# Patient Record
Sex: Female | Born: 1955 | Race: Black or African American | Hispanic: No | Marital: Single | Smoking: Never smoker
Health system: Southern US, Community
[De-identification: ages and names within clinical notes are randomized; demographics above are authoritative.]

---

## 2008-07-18 ENCOUNTER — Emergency Department (HOSPITAL_BASED_OUTPATIENT_CLINIC_OR_DEPARTMENT_OTHER): Admission: EM | Admit: 2008-07-18 | Discharge: 2008-07-18 | Payer: Self-pay | Admitting: Emergency Medicine

## 2008-07-18 ENCOUNTER — Ambulatory Visit: Payer: Self-pay | Admitting: Diagnostic Radiology

## 2009-12-22 ENCOUNTER — Ambulatory Visit (HOSPITAL_BASED_OUTPATIENT_CLINIC_OR_DEPARTMENT_OTHER): Admission: RE | Admit: 2009-12-22 | Discharge: 2009-12-22 | Payer: Self-pay | Admitting: Unknown Physician Specialty

## 2009-12-22 ENCOUNTER — Ambulatory Visit: Payer: Self-pay | Admitting: Diagnostic Radiology

## 2010-07-08 LAB — URINALYSIS, ROUTINE W REFLEX MICROSCOPIC
Bilirubin Urine: NEGATIVE
Protein, ur: NEGATIVE mg/dL
Urobilinogen, UA: 2 mg/dL — ABNORMAL HIGH (ref 0.0–1.0)

## 2010-07-08 LAB — BASIC METABOLIC PANEL
CO2: 26 mEq/L (ref 19–32)
Calcium: 9.4 mg/dL (ref 8.4–10.5)
Chloride: 107 mEq/L (ref 96–112)
GFR calc Af Amer: >60 mL/min (ref >60)
Sodium: 140 mEq/L (ref 135–145)

## 2010-07-08 LAB — CBC
Hemoglobin: 10.4 g/dL — ABNORMAL LOW (ref 12.0–15.0)
MCHC: 32.3 g/dL (ref 30.0–36.0)
MCV: 76.9 fL — ABNORMAL LOW (ref 78.0–100.0)
RBC: 4.2 MIL/uL (ref 3.87–5.11)
WBC: 5 10*3/uL (ref 4.0–10.5)

## 2010-07-08 LAB — DIFFERENTIAL
Basophils Relative: 3 % — ABNORMAL HIGH (ref 0–1)
Lymphs Abs: 1.2 10*3/uL (ref 0.7–4.0)
Monocytes Absolute: 0.9 10*3/uL (ref 0.1–1.0)
Monocytes Relative: 18 % — ABNORMAL HIGH (ref 3–12)
Neutro Abs: 2.6 10*3/uL (ref 1.7–7.7)

## 2010-12-07 ENCOUNTER — Other Ambulatory Visit (HOSPITAL_BASED_OUTPATIENT_CLINIC_OR_DEPARTMENT_OTHER): Payer: Self-pay | Admitting: Unknown Physician Specialty

## 2010-12-07 DIAGNOSIS — Z1231 Encounter for screening mammogram for malignant neoplasm of breast: Secondary | ICD-10-CM

## 2010-12-24 ENCOUNTER — Ambulatory Visit (HOSPITAL_BASED_OUTPATIENT_CLINIC_OR_DEPARTMENT_OTHER): Payer: Self-pay

## 2010-12-29 ENCOUNTER — Ambulatory Visit (HOSPITAL_BASED_OUTPATIENT_CLINIC_OR_DEPARTMENT_OTHER)
Admission: RE | Admit: 2010-12-29 | Discharge: 2010-12-29 | Disposition: A | Discharge status: Home / Self Care | Payer: Self-pay | Source: Ambulatory Visit | Attending: Unknown Physician Specialty | Admitting: Unknown Physician Specialty

## 2010-12-29 DIAGNOSIS — Z1231 Encounter for screening mammogram for malignant neoplasm of breast: Secondary | ICD-10-CM | POA: Insufficient documentation

## 2017-05-31 ENCOUNTER — Encounter: Payer: Self-pay | Admitting: Family Medicine

## 2017-05-31 ENCOUNTER — Ambulatory Visit: Payer: BC Managed Care – PPO | Admitting: Family Medicine

## 2017-05-31 VITALS — BP 136/90 | HR 79 | Ht 71.0 in | Wt 225.0 lb

## 2017-05-31 DIAGNOSIS — E0590 Thyrotoxicosis, unspecified without thyrotoxic crisis or storm: Secondary | ICD-10-CM | POA: Insufficient documentation

## 2017-05-31 DIAGNOSIS — Z0000 Encounter for general adult medical examination without abnormal findings: Secondary | ICD-10-CM | POA: Diagnosis not present

## 2017-05-31 DIAGNOSIS — I10 Essential (primary) hypertension: Secondary | ICD-10-CM | POA: Diagnosis not present

## 2017-05-31 DIAGNOSIS — E0500 Thyrotoxicosis with diffuse goiter without thyrotoxic crisis or storm: Secondary | ICD-10-CM | POA: Diagnosis not present

## 2017-05-31 LAB — COMPREHENSIVE METABOLIC PANEL
ALT: 10 U/L (ref 0–35)
AST: 13 U/L (ref 0–37)
Albumin: 4.3 g/dL (ref 3.5–5.2)
Alkaline Phosphatase: 83 U/L (ref 39–117)
BILIRUBIN TOTAL: 0.5 mg/dL (ref 0.2–1.2)
BUN: 17 mg/dL (ref 6–23)
CO2: 32 meq/L (ref 19–32)
CREATININE: 0.8 mg/dL (ref 0.40–1.20)
Calcium: 10.2 mg/dL (ref 8.4–10.5)
Chloride: 99 mEq/L (ref 96–112)
GFR: 94.27 mL/min (ref >60.00)
GLUCOSE: 102 mg/dL — AB (ref 70–99)
Potassium: 4 mEq/L (ref 3.5–5.1)
SODIUM: 137 meq/L (ref 135–145)
Total Protein: 7.8 g/dL (ref 6.0–8.3)

## 2017-05-31 LAB — CBC
HCT: 43.2 % (ref 36.0–46.0)
Hemoglobin: 14.7 g/dL (ref 12.0–15.0)
MCHC: 34 g/dL (ref 30.0–36.0)
MCV: 95.4 fl (ref 78.0–100.0)
Platelets: 211 10*3/uL (ref 150.0–400.0)
RBC: 4.53 Mil/uL (ref 3.87–5.11)
RDW: 15.7 % — AB (ref 11.5–15.5)
WBC: 4.5 10*3/uL (ref 4.0–10.5)

## 2017-05-31 LAB — URINALYSIS, ROUTINE W REFLEX MICROSCOPIC
BILIRUBIN URINE: NEGATIVE
Ketones, ur: NEGATIVE
LEUKOCYTES UA: NEGATIVE
Nitrite: NEGATIVE
PH: 6 (ref 5.0–8.0)
SPECIFIC GRAVITY, URINE: 1.015 (ref 1.000–1.030)
TOTAL PROTEIN, URINE-UPE24: NEGATIVE
UROBILINOGEN UA: 0.2 (ref 0.0–1.0)
Urine Glucose: NEGATIVE

## 2017-05-31 LAB — LIPID PANEL
CHOL/HDL RATIO: 3
Cholesterol: 217 mg/dL — ABNORMAL HIGH (ref 0–200)
HDL: 85.3 mg/dL (ref >39.00)
LDL CALC: 114 mg/dL — AB (ref 0–99)
NONHDL: 132.1
Triglycerides: 91 mg/dL (ref 0.0–149.0)
VLDL: 18.2 mg/dL (ref 0.0–40.0)

## 2017-05-31 MED ORDER — POTASSIUM CHLORIDE CRYS ER 20 MEQ PO TBCR: 20.0000 meq | EXTENDED_RELEASE_TABLET | Freq: Every day | ORAL | 0 refills | Status: DC

## 2017-05-31 MED ORDER — HYDROCHLOROTHIAZIDE 25 MG PO TABS: 25.0000 mg | ORAL_TABLET | Freq: Every day | ORAL | 0 refills | Status: DC

## 2017-05-31 NOTE — Progress Notes (Addendum)
Subjective:  Patient ID: Tammy Booker, female    DOB: [birth year]  Age: 62 y.o. MRN: [id number]  CC: Establish Care   HPI Tammy Booker presents for this is establishment of care and refill her medicines she has long-standing hypertension controlled with 25 mg of HCTZ.  She comes in on 20 mEq of potassium chloride twice daily.  She has also been taking Tapazole 15 mg daily for the last 9-10 years.  She had taken it for a year and then it was discontinued but she became hyperthyroid and it was restarted.  She has been taking his medicine ever since.  He has been seen [facility] the [facility] and tells me that blood work is regularly been drawn.  She lives alone.  She has 3 grown children.  2 sons and a daughter.  One her daughter is in the [employer].  Her father is 60 and has heart issues and alcoholism.  Some years ago he shot and killed her mother.  Pap and mammogram were performed late last year.  History Tammy Booker has no past medical history on file.   She has no past surgical history on file.   Her family history is not on file.She reports that  has never smoked. she has never used smokeless tobacco. Her alcohol and drug histories are not on file.  Outpatient Medications Prior to Visit  Medication Sig Dispense Refill  . hydrochlorothiazide (HYDRODIURIL) 25 MG tablet Take 25 mg by mouth daily.    . methimazole (TAPAZOLE) 10 MG tablet Take 1 and 1/2 tablets by mouth daily.    . potassium chloride SA (K-DUR,KLOR-CON) 20 MEQ tablet Take 20 mEq by mouth 2 (two) times daily.     No facility-administered medications prior to visit.     ROS Review of Systems  Constitutional: Negative.   HENT: Negative.   Eyes: Negative for photophobia and visual disturbance.  Respiratory: Negative.   Cardiovascular: Negative.  Negative for palpitations.  Gastrointestinal: Negative.   Endocrine: Negative for cold intolerance and heat intolerance.  Genitourinary: Negative.   Musculoskeletal: Negative for  arthralgias and myalgias.  Skin: Negative.   Allergic/Immunologic: Negative for immunocompromised state.  Neurological: Negative.   Hematological: Negative.   Psychiatric/Behavioral: Negative.     Objective:  BP 136/90 (BP Location: Right Arm, Patient Position: Sitting, Cuff Size: Normal)   Pulse 79   Ht 5\' 11"  (1.803 m)   Wt 225 lb (102.1 kg)   SpO2 98%   BMI 31.38 kg/m   Physical Exam  Constitutional: She is oriented to person, place, and time. She appears well-developed and well-nourished. No distress.  HENT:  Head: Normocephalic and atraumatic.  Right Ear: External ear normal.  Left Ear: External ear normal.  Mouth/Throat: Oropharynx is clear and moist. No oropharyngeal exudate.  Eyes: Conjunctivae are normal. Pupils are equal, round, and reactive to light. Right eye exhibits no discharge. Left eye exhibits no discharge. No scleral icterus.  Neck: Neck supple. No JVD present. No tracheal deviation present. No thyromegaly (normal size without masses. no temdermess to palpation) present.  Cardiovascular: Normal rate, regular rhythm and normal heart sounds.  Pulmonary/Chest: Effort normal and breath sounds normal. No stridor.  Abdominal: Bowel sounds are normal.  Lymphadenopathy:    She has no cervical adenopathy.  Neurological: She is alert and oriented to person, place, and time.  Skin: Skin is warm and dry. She is not diaphoretic.  Psychiatric: She has a normal mood and affect.      Assessment & Plan:  Tammy Booker was seen today for establish care.  Diagnoses and all orders for this visit:  Essential hypertension -     CBC -     Comprehensive metabolic panel -     hydrochlorothiazide (HYDRODIURIL) 25 MG tablet; Take 1 tablet (25 mg total) by mouth daily. -     potassium chloride SA (K-DUR,KLOR-CON) 20 MEQ tablet; Take 1 tablet (20 mEq total) by mouth daily.  Hyperthyroidism -     CBC -     Cancel: Thyroid Profile -     Thyroid stimulating immunoglobulin -      Thyroid Profile; Future  Health care maintenance -     CBC -     Comprehensive metabolic panel -     HIV antibody -     Lipid panel -     Urinalysis, Routine w reflex microscopic  Graves disease -     Thyroid stimulating immunoglobulin -     methimazole (TAPAZOLE) 10 MG tablet; Take 1.5 tablets (15 mg total) by mouth daily. Take 1 and 1/2 tablets by mouth daily. -     Ambulatory referral to Endocrinology  Is I have changed Tammy Booker's hydrochlorothiazide, potassium chloride SA, and methimazole.  Meds ordered this encounter  Medications  . hydrochlorothiazide (HYDRODIURIL) 25 MG tablet    Sig: Take 1 tablet (25 mg total) by mouth daily.    Dispense:  100 tablet    Refill:  0  . potassium chloride SA (K-DUR,KLOR-CON) 20 MEQ tablet    Sig: Take 1 tablet (20 mEq total) by mouth daily.    Dispense:  100 tablet    Refill:  0  . methimazole (TAPAZOLE) 10 MG tablet    Sig: Take 1.5 tablets (15 mg total) by mouth daily. Take 1 and 1/2 tablets by mouth daily.    Dispense:  135 tablet    Refill:  1   ON [employer] and then sign and this is for the annual on his visit reference manual spine and anxiety as patient appears to her that she also noticed every  We will check thyroid functions and thyroid antibodies today.  And hopefully will proceed with discontinuation of the Tapazole.  We will monitor her thyroid functions every 3 months for recurrence of her Graves' disease his long-standing bodies have returned to normal.  Went ahead and checked the rest of her blood work today however she is not fasting today.  She will need a colonoscopy at some point.  Follow-up: Return in about 3 months (around 08/31/2017).  Mliss SaxWilliam Alfred Ashonte Angelucci, MD

## 2017-05-31 NOTE — Patient Instructions (Addendum)
DASH Eating Plan DASH stands for "Dietary Approaches to Stop Hypertension." The DASH eating plan is a healthy eating plan that has been shown to reduce high blood pressure (hypertension). It may also reduce your risk for type 2 diabetes, heart disease, and stroke. The DASH eating plan may also help with weight loss. What are tips for following this plan? General guidelines  Avoid eating more than 2,300 mg (milligrams) of salt (sodium) a day. If you have hypertension, you may need to reduce your sodium intake to 1,500 mg a day.  Limit alcohol intake to no more than 1 drink a day for nonpregnant women and 2 drinks a day for men. One drink equals 12 oz of beer, 5 oz of wine, or 1 oz of hard liquor.  Work with your health care provider to maintain a healthy body weight or to lose weight. Ask what an ideal weight is for you.  Get at least 30 minutes of exercise that causes your heart to beat faster (aerobic exercise) most days of the week. Activities may include walking, swimming, or biking.  Work with your health care provider or diet and nutrition specialist (dietitian) to adjust your eating plan to your individual calorie needs. Reading food labels  Check food labels for the amount of sodium per serving. Choose foods with less than 5 percent of the Daily Value of sodium. Generally, foods with less than 300 mg of sodium per serving fit into this eating plan.  To find whole grains, look for the word "whole" as the first word in the ingredient list. Shopping  Buy products labeled as "low-sodium" or "no salt added."  Buy fresh foods. Avoid canned foods and premade or frozen meals. Cooking  Avoid adding salt when cooking. Use salt-free seasonings or herbs instead of table salt or sea salt. Check with your health care provider or pharmacist before using salt substitutes.  Do not fry foods. Cook foods using healthy methods such as baking, boiling, grilling, and broiling instead.  Cook with  heart-healthy oils, such as olive, canola, soybean, or sunflower oil. Meal planning   Eat a balanced diet that includes: ? 5 or more servings of fruits and vegetables each day. At each meal, try to fill half of your plate with fruits and vegetables. ? Up to 6-8 servings of whole grains each day. ? Less than 6 oz of lean meat, poultry, or fish each day. A 3-oz serving of meat is about the same size as a deck of cards. One egg equals 1 oz. ? 2 servings of low-fat dairy each day. ? A serving of nuts, seeds, or beans 5 times each week. ? Heart-healthy fats. Healthy fats called Omega-3 fatty acids are found in foods such as flaxseeds and coldwater fish, like sardines, salmon, and mackerel.  Limit how much you eat of the following: ? Canned or prepackaged foods. ? Food that is high in trans fat, such as fried foods. ? Food that is high in saturated fat, such as fatty meat. ? Sweets, desserts, sugary drinks, and other foods with added sugar. ? Full-fat dairy products.  Do not salt foods before eating.  Try to eat at least 2 vegetarian meals each week.  Eat more home-cooked food and less restaurant, buffet, and fast food.  When eating at a restaurant, ask that your food be prepared with less salt or no salt, if possible. What foods are recommended? The items listed may not be a complete list. Talk with your dietitian about what   dietary choices are best for you. Grains Whole-grain or whole-wheat bread. Whole-grain or whole-wheat pasta. Brown rice. Oatmeal. Quinoa. Bulgur. Whole-grain and low-sodium cereals. Pita bread. Low-fat, low-sodium crackers. Whole-wheat flour tortillas. Vegetables Fresh or frozen vegetables (raw, steamed, roasted, or grilled). Low-sodium or reduced-sodium tomato and vegetable juice. Low-sodium or reduced-sodium tomato sauce and tomato paste. Low-sodium or reduced-sodium canned vegetables. Fruits All fresh, dried, or frozen fruit. Canned fruit in natural juice (without  added sugar). Meat and other protein foods Skinless chicken or turkey. Ground chicken or turkey. Pork with fat trimmed off. Fish and seafood. Egg whites. Dried beans, peas, or lentils. Unsalted nuts, nut butters, and seeds. Unsalted canned beans. Lean cuts of beef with fat trimmed off. Low-sodium, lean deli meat. Dairy Low-fat (1%) or fat-free (skim) milk. Fat-free, low-fat, or reduced-fat cheeses. Nonfat, low-sodium ricotta or cottage cheese. Low-fat or nonfat yogurt. Low-fat, low-sodium cheese. Fats and oils Soft margarine without trans fats. Vegetable oil. Low-fat, reduced-fat, or light mayonnaise and salad dressings (reduced-sodium). Canola, safflower, olive, soybean, and sunflower oils. Avocado. Seasoning and other foods Herbs. Spices. Seasoning mixes without salt. Unsalted popcorn and pretzels. Fat-free sweets. What foods are not recommended? The items listed may not be a complete list. Talk with your dietitian about what dietary choices are best for you. Grains Baked goods made with fat, such as croissants, muffins, or some breads. Dry pasta or rice meal packs. Vegetables Creamed or fried vegetables. Vegetables in a cheese sauce. Regular canned vegetables (not low-sodium or reduced-sodium). Regular canned tomato sauce and paste (not low-sodium or reduced-sodium). Regular tomato and vegetable juice (not low-sodium or reduced-sodium). Pickles. Olives. Fruits Canned fruit in a light or heavy syrup. Fried fruit. Fruit in cream or butter sauce. Meat and other protein foods Fatty cuts of meat. Ribs. Fried meat. Bacon. Sausage. Bologna and other processed lunch meats. Salami. Fatback. Hotdogs. Bratwurst. Salted nuts and seeds. Canned beans with added salt. Canned or smoked fish. Whole eggs or egg yolks. Chicken or turkey with skin. Dairy Whole or 2% milk, cream, and half-and-half. Whole or full-fat cream cheese. Whole-fat or sweetened yogurt. Full-fat cheese. Nondairy creamers. Whipped toppings.  Processed cheese and cheese spreads. Fats and oils Butter. Stick margarine. Lard. Shortening. Ghee. Bacon fat. Tropical oils, such as coconut, palm kernel, or palm oil. Seasoning and other foods Salted popcorn and pretzels. Onion salt, garlic salt, seasoned salt, table salt, and sea salt. Worcestershire sauce. Tartar sauce. Barbecue sauce. Teriyaki sauce. Soy sauce, including reduced-sodium. Steak sauce. Canned and packaged gravies. Fish sauce. Oyster sauce. Cocktail sauce. Horseradish that you find on the shelf. Ketchup. Mustard. Meat flavorings and tenderizers. Bouillon cubes. Hot sauce and Tabasco sauce. Premade or packaged marinades. Premade or packaged taco seasonings. Relishes. Regular salad dressings. Where to find more information:  National Heart, Lung, and Blood Institute: www.nhlbi.nih.gov  American Heart Association: www.heart.org Summary  The DASH eating plan is a healthy eating plan that has been shown to reduce high blood pressure (hypertension). It may also reduce your risk for type 2 diabetes, heart disease, and stroke.  With the DASH eating plan, you should limit salt (sodium) intake to 2,300 mg a day. If you have hypertension, you may need to reduce your sodium intake to 1,500 mg a day.  When on the DASH eating plan, aim to eat more fresh fruits and vegetables, whole grains, lean proteins, low-fat dairy, and heart-healthy fats.  Work with your health care provider or diet and nutrition specialist (dietitian) to adjust your eating plan to your individual   calorie needs. This information is not intended to replace advice given to you by your health care provider. Make sure you discuss any questions you have with your health care provider. Document Released: 03/04/2011 Document Revised: 03/08/2016 Document Reviewed: 03/08/2016 Elsevier Interactive Patient Education  2018 Elsevier Inc.  

## 2017-06-02 LAB — THYROID STIMULATING IMMUNOGLOBULIN: TSI: 219 % baseline — ABNORMAL HIGH (ref <140)

## 2017-06-02 LAB — HIV ANTIBODY (ROUTINE TESTING W REFLEX): HIV 1&2 Ab, 4th Generation: NONREACTIVE

## 2017-06-03 NOTE — Addendum Note (Signed)
Addended by: Marcell AngerSELF, SARAH E on: 06/03/2017 11:08 AM   Modules accepted: Orders

## 2017-06-09 ENCOUNTER — Other Ambulatory Visit (INDEPENDENT_AMBULATORY_CARE_PROVIDER_SITE_OTHER): Payer: BC Managed Care – PPO

## 2017-06-09 DIAGNOSIS — E0590 Thyrotoxicosis, unspecified without thyrotoxic crisis or storm: Secondary | ICD-10-CM

## 2017-06-10 LAB — THYROID PANEL
Free Thyroxine Index: 1.8 (ref 1.2–4.9)
T3 Uptake Ratio: 27 % (ref 24–39)
T4 TOTAL: 6.6 ug/dL (ref 4.5–12.0)

## 2017-06-10 MED ORDER — METHIMAZOLE 10 MG PO TABS: 15.0000 mg | ORAL_TABLET | Freq: Every day | ORAL | 1 refills | Status: DC

## 2017-06-10 NOTE — Addendum Note (Signed)
Addended by: Andrez GrimeKREMER, WILLIAM A on: 06/10/2017 08:22 AM   Modules accepted: Orders

## 2017-06-17 ENCOUNTER — Telehealth: Payer: Self-pay

## 2017-06-17 NOTE — Telephone Encounter (Signed)
Patient is inquiring on the graves disease. I had tried to contact patient twice with no return call about her results, so they were mailed to her home on Wednesday.     Copied from CRM [phone]. Topic: Referral - Question >> Jun 17, 2017  2:09 PM Guinevere FerrariMorris, Sharamare E, NT wrote: CRM for notification. See Telephone encounter for: 06/17/17. Patient called and wanted to see if the doctor could call her back regarding her referral to Endo. Pt would like a call back. Pt said that the office has already tried to contact her but she wasn't aware of everything that is going on.

## 2017-06-20 NOTE — Telephone Encounter (Signed)
Informed patient of recent lab results & provider's recommendations. She voiced understanding. Also, patient confirmed that she had received the letter that was mailed to her home address on last Wednesday. She inquired about the phone number to call [facility] to setup appointment. RN provided phone number to the office. Before call ended, patient did not have any further questions or concerns.

## 2017-09-01 ENCOUNTER — Ambulatory Visit: Payer: BC Managed Care – PPO | Admitting: Endocrinology

## 2017-10-28 ENCOUNTER — Ambulatory Visit: Payer: BC Managed Care – PPO | Admitting: Endocrinology

## 2017-10-28 DIAGNOSIS — Z0289 Encounter for other administrative examinations: Secondary | ICD-10-CM

## 2017-12-06 ENCOUNTER — Other Ambulatory Visit: Payer: Self-pay | Admitting: Family Medicine

## 2017-12-06 DIAGNOSIS — I10 Essential (primary) hypertension: Secondary | ICD-10-CM

## 2018-02-27 ENCOUNTER — Other Ambulatory Visit: Payer: Self-pay | Admitting: Family Medicine

## 2018-02-27 DIAGNOSIS — I10 Essential (primary) hypertension: Secondary | ICD-10-CM

## 2018-04-07 ENCOUNTER — Other Ambulatory Visit: Payer: Self-pay | Admitting: Family Medicine

## 2018-04-07 DIAGNOSIS — I10 Essential (primary) hypertension: Secondary | ICD-10-CM

## 2018-06-27 ENCOUNTER — Other Ambulatory Visit: Payer: Self-pay | Admitting: Family Medicine

## 2018-06-27 DIAGNOSIS — I10 Essential (primary) hypertension: Secondary | ICD-10-CM

## 2018-06-27 DIAGNOSIS — E0500 Thyrotoxicosis with diffuse goiter without thyrotoxic crisis or storm: Secondary | ICD-10-CM

## 2018-07-18 ENCOUNTER — Telehealth: Payer: Self-pay | Admitting: Family Medicine

## 2018-07-18 NOTE — Telephone Encounter (Signed)
Called and left vm for patient. Calling to schedule virtual visit with Dr. Kremer.  °

## 2018-07-27 ENCOUNTER — Other Ambulatory Visit: Payer: Self-pay | Admitting: Family Medicine

## 2018-07-27 DIAGNOSIS — I10 Essential (primary) hypertension: Secondary | ICD-10-CM

## 2018-07-27 DIAGNOSIS — E0500 Thyrotoxicosis with diffuse goiter without thyrotoxic crisis or storm: Secondary | ICD-10-CM

## 2018-07-31 ENCOUNTER — Ambulatory Visit (INDEPENDENT_AMBULATORY_CARE_PROVIDER_SITE_OTHER): Payer: BC Managed Care – PPO | Admitting: Family Medicine

## 2018-07-31 ENCOUNTER — Encounter: Payer: Self-pay | Admitting: Family Medicine

## 2018-07-31 VITALS — Ht 71.0 in

## 2018-07-31 DIAGNOSIS — Z9119 Patient's noncompliance with other medical treatment and regimen: Secondary | ICD-10-CM

## 2018-07-31 DIAGNOSIS — Z91199 Patient's noncompliance with other medical treatment and regimen due to unspecified reason: Secondary | ICD-10-CM | POA: Insufficient documentation

## 2018-07-31 DIAGNOSIS — E0590 Thyrotoxicosis, unspecified without thyrotoxic crisis or storm: Secondary | ICD-10-CM | POA: Diagnosis not present

## 2018-07-31 DIAGNOSIS — E0500 Thyrotoxicosis with diffuse goiter without thyrotoxic crisis or storm: Secondary | ICD-10-CM

## 2018-07-31 DIAGNOSIS — I10 Essential (primary) hypertension: Secondary | ICD-10-CM | POA: Diagnosis not present

## 2018-07-31 MED ORDER — CHLORTHALIDONE 25 MG PO TABS: 25.0000 mg | ORAL_TABLET | Freq: Every day | ORAL | 1 refills | Status: DC

## 2018-07-31 NOTE — Progress Notes (Signed)
Virtual Visit via Telephone Note  I connected with Tammy Booker on 07/31/18 at  3:30 PM EDT by telephone and verified that I am speaking with the correct person using two identifiers.  Location: Patient: home Provider:    I discussed the limitations, risks, security and privacy concerns of performing an evaluation and management service by telephone and the availability of in person appointments. I also discussed with the patient that there may be a patient responsible charge related to this service. The patient expressed understanding and agreed to proceed.  Interactive video and audio telecommunications were attempted between myself and the patient. However they failed due to the patient having technical difficulties or not having access to video capability. We continued and completed with audio only.   History of Present Illness:    Observations/Objective:   Assessment and Plan:   Follow Up Instructions:    I discussed the assessment and treatment plan with the patient. The patient was provided an opportunity to ask questions and all were answered. The patient agreed with the plan and demonstrated an understanding of the instructions.   The patient was advised to call back or seek an in-person evaluation if the symptoms worsen or if the condition fails to improve as anticipated.  I provided 20    Established Patient Office Visit  Subjective:  Patient ID: Tammy Booker, female    DOB: Aug 10, 1955  Age: 63 y.o. MRN: [id number]  CC:  Chief Complaint  Patient presents with  . Follow-up    HPI Tammy LowensteinCarrie Arman presents for follow-up of her hypertension and hypothyroidism.  Patient last seen in March of this past year.  She had been asked to follow-up in June of last year.  She has been out of the Tapazole but says that her pharmacy has been filling her blood pressure prescription although she says that they have not been feel filling her potassium pill.  Although she did not sound  certain she says that her blood pressure is been running in the 130s over 80s.  She has never smoked.  Denies the use of illicit drugs.  Admits to 2 glasses of wine daily.  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  She lives alone.  History reviewed. No pertinent past medical history.  History reviewed. No pertinent surgical history.  History reviewed. No pertinent family history.  Social History   Socioeconomic History  . Marital status: Single    Spouse name: Not on file  . Number of children: Not on file  . Years of education: Not on file  . Highest education level: Not on file  Occupational History  . Not on file  Social Needs  . Financial resource strain: Not on file  . Food insecurity:    Worry: Not on file    Inability: Not on file  . Transportation needs:    Medical: Not on file    Non-medical: Not on file  Tobacco Use  . Smoking status: Never Smoker  . Smokeless tobacco: Never Used  Substance and Sexual Activity  . Alcohol use: Yes    Comment: 2 glasses of wine per day  . Drug use: Not Currently  . Sexual activity: Not on file  Lifestyle  . Physical activity:    Days per week: Not on file    Minutes per session: Not on file  . Stress: Not on file  Relationships  . Social  connections:    Talks on phone: Not on file    Gets together: Not on file    Attends religious service: Not on file    Active member of club or organization: Not on file    Attends meetings of clubs or organizations: Not on file    Relationship status: Not on file  . Intimate partner violence:    Fear of current or ex partner: Not on file    Emotionally abused: Not on file    Physically abused: Not on file    Forced sexual activity: Not on file  Other Topics Concern  . Not on file  Social History Narrative  . Not on file    Outpatient Medications Prior to Visit  Medication Sig Dispense  Refill  . methimazole (TAPAZOLE) 10 MG tablet TAKE 1 & 1/2 (ONE & ONE-HALF) TABLETS BY MOUTH ONCE DAILY 45 tablet 0  . potassium chloride SA (K-DUR,KLOR-CON) 20 MEQ tablet Take 1 tablet by mouth once daily 30 tablet 0  . hydrochlorothiazide (HYDRODIURIL) 25 MG tablet TAKE 1 TABLET BY MOUTH ONCE DAILY NEED  OFFICE  VISIT 30 tablet 3   No facility-administered medications prior to visit.     No Known Allergies  ROS Review of Systems  Constitutional: Negative.   Respiratory: Negative.   Cardiovascular: Negative.   Gastrointestinal: Negative.   Endocrine: Negative for cold intolerance and heat intolerance.  Neurological: Negative for headaches.  Psychiatric/Behavioral: Negative.       Objective:    Physical Exam  Constitutional: She is oriented to person, place, and time. No distress.  Pulmonary/Chest: Effort normal.  Neurological: She is alert and oriented to person, place, and time.  Psychiatric: She has a normal mood and affect. Her behavior is normal.    Ht  (1.803 m)   BMI 31.38 kg/m  Wt Readings from Last 3 Encounters:  05/31/17 225 lb (102.1 kg)     Health Maintenance Due  Topic Date Due  . Hepatitis C Screening  Aug 05, 1955  . TETANUS/TDAP  12/12/1976  . COLONOSCOPY  12/13/2007  . MAMMOGRAM  12/28/2012    There are no preventive care reminders to display for this patient.  No results found for: TSH Lab Results  Component Value Date   WBC 4.5 05/31/2017   HGB 14.7 05/31/2017   HCT 43.2 05/31/2017   MCV 95.4 05/31/2017   PLT 211.0 05/31/2017   Lab Results  Component Value Date   NA 137 05/31/2017   K 4.0 05/31/2017   CO2 32 05/31/2017   GLUCOSE 102 (H) 05/31/2017   BUN 17 05/31/2017   CREATININE 0.80 05/31/2017   BILITOT 0.5 05/31/2017   ALKPHOS 83 05/31/2017   AST 13 05/31/2017   ALT 10 05/31/2017   PROT 7.8 05/31/2017   ALBUMIN 4.3 05/31/2017   CALCIUM 10.2 05/31/2017   GFR 94.27 05/31/2017   Lab Results  Component Value Date    CHOL 217 (H) 05/31/2017   Lab Results  Component Value Date   HDL 85.30 05/31/2017   Lab Results  Component Value Date   LDLCALC 114 (H) 05/31/2017   Lab Results  Component Value Date   TRIG 91.0 05/31/2017   Lab Results  Component Value Date   CHOLHDL 3 05/31/2017   No results found for: HGBA1C    Assessment & Plan:   Problem List Items Addressed This Visit      Cardiovascular and Mediastinum   Essential hypertension - Primary   Relevant Medications  chlorthalidone (HYGROTON) 25 MG tablet     Endocrine   Hyperthyroidism   Relevant Orders   T3, free   Thyroid stimulating immunoglobulin   Graves disease   Relevant Orders   TSH   T3, free   Thyroid stimulating immunoglobulin     Other   Non-compliant patient      Meds ordered this encounter  Medications  . chlorthalidone (HYGROTON) 25 MG tablet    Sig: Take 1 tablet (25 mg total) by mouth daily for 30 days.    Dispense:  30 tablet    Refill:  1    Follow-up: No follow-ups on file.    Mliss Sax, MDminutes of non-face-to-face time during this encounter.  Patient will call and make a lab visit and then follow-up to see me face-to-face in 1 month.

## 2018-08-08 ENCOUNTER — Other Ambulatory Visit (INDEPENDENT_AMBULATORY_CARE_PROVIDER_SITE_OTHER): Payer: BC Managed Care – PPO

## 2018-08-08 DIAGNOSIS — E0590 Thyrotoxicosis, unspecified without thyrotoxic crisis or storm: Secondary | ICD-10-CM

## 2018-08-08 NOTE — Progress Notes (Signed)
Patient and lab staff wore masks for lab visit (labs from 07/31/2018 visit) per COVID-19 protocol. -DMG

## 2018-08-09 LAB — TSH: TSH: 1.27 u[IU]/mL (ref 0.35–4.50)

## 2018-08-09 LAB — T3, FREE: T3, Free: 2.9 pg/mL (ref 2.3–4.2)

## 2018-08-13 LAB — THYROID STIMULATING IMMUNOGLOBULIN: TSI: <89 % baseline (ref <140)

## 2018-08-22 ENCOUNTER — Telehealth: Payer: Self-pay | Admitting: Family Medicine

## 2018-08-22 NOTE — Telephone Encounter (Signed)
Pt wants to get her Tdap done is it time for it

## 2018-08-22 NOTE — Telephone Encounter (Signed)
Okay 

## 2018-08-23 NOTE — Telephone Encounter (Signed)
Okay to schedule, thank you! 

## 2018-08-23 NOTE — Telephone Encounter (Signed)
Tried to call, left message °

## 2018-09-05 ENCOUNTER — Ambulatory Visit (INDEPENDENT_AMBULATORY_CARE_PROVIDER_SITE_OTHER): Payer: BC Managed Care – PPO | Admitting: Family Medicine

## 2018-09-05 ENCOUNTER — Encounter: Payer: Self-pay | Admitting: Family Medicine

## 2018-09-05 DIAGNOSIS — I10 Essential (primary) hypertension: Secondary | ICD-10-CM | POA: Diagnosis not present

## 2018-09-05 DIAGNOSIS — E0590 Thyrotoxicosis, unspecified without thyrotoxic crisis or storm: Secondary | ICD-10-CM | POA: Diagnosis not present

## 2018-09-05 NOTE — Progress Notes (Addendum)
Virtual Visit via Telephone Note  I connected with Tammy Booker on 09/15/18 at 10:30 AM EDT by telephone and verified that I am speaking with the correct person using two identifiers.  Location: Patient: home Provider:    I discussed the limitations, risks, security and privacy concerns of performing an evaluation and management service by telephone and the availability of in person appointments. I also discussed with the patient that there may be a patient responsible charge related to this service. The patient expressed understanding and agreed to proceed.   History of Present Illness:    Observations/Objective:   Assessment and Plan:   Follow Up Instructions:    I discussed the assessment and treatment plan with the patient. The patient was provided an opportunity to ask questions and all were answered. The patient agreed with the plan and demonstrated an understanding of the instructions.   The patient was advised to call back or seek an in-person evaluation if the symptoms worsen or if the condition fails to improve as anticipated.  I provided 10 minutes of non-face-to-face time during this encounter.   Established Patient Office Visit  Subjective:  Patient ID: Tammy Booker, female    DOB: 05/01/1955  Age: 63 y.o. MRN: [id number]  CC:  Chief Complaint  Patient presents with  . Follow-up    HPI Tammy Booker presents for follow-up of her blood pressure.  Pressures been running in the 130 over 90s range.  Denies urinary frequency with the chlorthalidone.  Denies other issues with it.  She is no longer taking the Tapazole and thyroid functions are normal.  History reviewed. No pertinent past medical history.  History reviewed. No pertinent surgical history.  History reviewed. No pertinent family history.  Social History   Socioeconomic History  . Marital status: Single    Spouse name: Not on file  . Number of children: Not on file  . Years of education: Not on  file  . Highest education level: Not on file  Occupational History  . Not on file  Social Needs  . Financial resource strain: Not on file  . Food insecurity    Worry: Not on file    Inability: Not on file  . Transportation needs    Medical: Not on file    Non-medical: Not on file  Tobacco Use  . Smoking status: Never Smoker  . Smokeless tobacco: Never Used  Substance and Sexual Activity  . Alcohol use: Yes    Comment: 2 glasses of wine per day  . Drug use: Not Currently  . Sexual activity: Not on file  Lifestyle  . Physical activity    Days per week: Not on file    Minutes per session: Not on file  . Stress: Not on file  Relationships  . Social Musicianconnections    Talks on phone: Not on file    Gets together: Not on file    Attends religious service: Not on file    Active member of club or organization: Not on file    Attends meetings of clubs or organizations: Not on file    Relationship status: Not on file  . Intimate partner violence    Fear of current or ex partner: Not on file    Emotionally abused: Not on file    Physically abused: Not on file    Forced sexual activity: Not on file  Other Topics Concern  . Not on file  Social History Narrative  . Not on file  Outpatient Medications Prior to Visit  Medication Sig Dispense Refill  . potassium chloride SA (K-DUR,KLOR-CON) 20 MEQ tablet Take 1 tablet by mouth once daily 30 tablet 0  . methimazole (TAPAZOLE) 10 MG tablet TAKE 1 & 1/2 (ONE & ONE-HALF) TABLETS BY MOUTH ONCE DAILY 45 tablet 0  . chlorthalidone (HYGROTON) 25 MG tablet Take 1 tablet (25 mg total) by mouth daily for 30 days. 30 tablet 1   No facility-administered medications prior to visit.     No Known Allergies  ROS Review of Systems  Respiratory: Negative.   Cardiovascular: Negative.   Gastrointestinal: Negative.   Endocrine: Negative for cold intolerance and heat intolerance.  Psychiatric/Behavioral: Negative.       Objective:     Physical Exam  Constitutional: She is oriented to person, place, and time. No distress.  Pulmonary/Chest: Effort normal.  Neurological: She is alert and oriented to person, place, and time.  Psychiatric: She has a normal mood and affect. Her behavior is normal.    There were no vitals taken for this visit. Wt Readings from Last 3 Encounters:  05/31/17 225 lb (102.1 kg)     Health Maintenance Due  Topic Date Due  . Hepatitis C Screening  07-24-1955  . TETANUS/TDAP  12/12/1976  . COLONOSCOPY  12/13/2007  . MAMMOGRAM  12/28/2012    There are no preventive care reminders to display for this patient.  Lab Results  Component Value Date   TSH 1.27 08/08/2018   Lab Results  Component Value Date   WBC 4.5 05/31/2017   HGB 14.7 05/31/2017   HCT 43.2 05/31/2017   MCV 95.4 05/31/2017   PLT 211.0 05/31/2017   Lab Results  Component Value Date   NA 137 05/31/2017   K 4.0 05/31/2017   CO2 32 05/31/2017   GLUCOSE 102 (H) 05/31/2017   BUN 17 05/31/2017   CREATININE 0.80 05/31/2017   BILITOT 0.5 05/31/2017   ALKPHOS 83 05/31/2017   AST 13 05/31/2017   ALT 10 05/31/2017   PROT 7.8 05/31/2017   ALBUMIN 4.3 05/31/2017   CALCIUM 10.2 05/31/2017   GFR 94.27 05/31/2017   Lab Results  Component Value Date   CHOL 217 (H) 05/31/2017   Lab Results  Component Value Date   HDL 85.30 05/31/2017   Lab Results  Component Value Date   LDLCALC 114 (H) 05/31/2017   Lab Results  Component Value Date   TRIG 91.0 05/31/2017   Lab Results  Component Value Date   CHOLHDL 3 05/31/2017   No results found for: HGBA1C    Assessment & Plan:   Problem List Items Addressed This Visit      Cardiovascular and Mediastinum   Essential hypertension - Primary     Endocrine   Hyperthyroidism      No orders of the defined types were placed in this encounter.   Follow-up: Return in about 1 month (around 10/05/2018), or [id number] in one month.    Libby Maw, MD  Patient will  follow-up fasting in 1 month for recheck of her blood pressure and blood work.

## 2018-09-22 ENCOUNTER — Other Ambulatory Visit: Payer: Self-pay | Admitting: Family Medicine

## 2018-09-22 DIAGNOSIS — E0500 Thyrotoxicosis with diffuse goiter without thyrotoxic crisis or storm: Secondary | ICD-10-CM

## 2018-10-04 ENCOUNTER — Telehealth: Payer: Self-pay

## 2018-10-04 NOTE — Telephone Encounter (Signed)

## 2018-10-05 ENCOUNTER — Encounter: Payer: Self-pay | Admitting: Family Medicine

## 2018-10-05 ENCOUNTER — Ambulatory Visit: Payer: BC Managed Care – PPO | Admitting: Family Medicine

## 2018-10-05 ENCOUNTER — Telehealth: Payer: Self-pay | Admitting: Family Medicine

## 2018-10-05 VITALS — BP 130/90 | HR 76 | Ht 71.0 in | Wt 225.2 lb

## 2018-10-05 DIAGNOSIS — L280 Lichen simplex chronicus: Secondary | ICD-10-CM

## 2018-10-05 DIAGNOSIS — E0590 Thyrotoxicosis, unspecified without thyrotoxic crisis or storm: Secondary | ICD-10-CM

## 2018-10-05 DIAGNOSIS — I10 Essential (primary) hypertension: Secondary | ICD-10-CM | POA: Diagnosis not present

## 2018-10-05 LAB — BASIC METABOLIC PANEL
BUN: 14 mg/dL (ref 6–23)
CO2: 33 mEq/L — ABNORMAL HIGH (ref 19–32)
Calcium: 9.7 mg/dL (ref 8.4–10.5)
Chloride: 96 mEq/L (ref 96–112)
Creatinine, Ser: 0.81 mg/dL (ref 0.40–1.20)
GFR: 87.03 mL/min (ref >60.00)
Glucose, Bld: 96 mg/dL (ref 70–99)
Potassium: 3.7 mEq/L (ref 3.5–5.1)
Sodium: 139 mEq/L (ref 135–145)

## 2018-10-05 LAB — TSH: TSH: 0.6 u[IU]/mL (ref 0.35–4.50)

## 2018-10-05 MED ORDER — BLOOD PRESSURE CUFF MISC: 0 refills | Status: DC

## 2018-10-05 MED ORDER — CHLORTHALIDONE 25 MG PO TABS: 25.0000 mg | ORAL_TABLET | Freq: Every day | ORAL | 1 refills | Status: DC

## 2018-10-05 MED ORDER — POTASSIUM CHLORIDE CRYS ER 20 MEQ PO TBCR: 20.0000 meq | EXTENDED_RELEASE_TABLET | Freq: Every day | ORAL | 1 refills | Status: DC

## 2018-10-05 MED ORDER — TRIAMCINOLONE ACETONIDE 0.5 % EX OINT: TOPICAL_OINTMENT | CUTANEOUS | 0 refills | Status: DC

## 2018-10-05 NOTE — Telephone Encounter (Signed)
rx sent

## 2018-10-05 NOTE — Telephone Encounter (Signed)
Pt called and stated that the Blood Pressure Monitoring (BLOOD PRESSURE CUFF) MISC [[id number]]   needs to be sent to [facility] on [address] [place]

## 2018-10-05 NOTE — Progress Notes (Signed)
Established Patient Office Visit  Subjective:  Patient ID: Tammy Booker, female    DOB: Sep 06, 1955  Age: 63 y.o. MRN: [id number]  CC:  Chief Complaint  Patient presents with  . Follow-up    HPI Tammy Booker presents for follow-up of her blood pressure and hyperthyroidism.  Blood pressure is been controlled with the chlorthalidone.  She is taking her potassium daily.  Denies any symptoms of hyperthyroidism to include tenderness at the base of her neck, palpitations, weight loss or heat intolerance.  She lives alone at this point.  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  He is not certain about her job in the fall. History reviewed. No pertinent past medical history.  History reviewed. No pertinent surgical history.  History reviewed. No pertinent family history.  Social History   Socioeconomic History  . Marital status: Single    Spouse name: Not on file  . Number of children: Not on file  . Years of education: Not on file  . Highest education level: Not on file  Occupational History  . Not on file  Social Needs  . Financial resource strain: Not on file  . Food insecurity    Worry: Not on file    Inability: Not on file  . Transportation needs    Medical: Not on file    Non-medical: Not on file  Tobacco Use  . Smoking status: Never Smoker  . Smokeless tobacco: Never Used  Substance and Sexual Activity  . Alcohol use: Yes    Comment: 2 glasses of wine per day  . Drug use: Not Currently  . Sexual activity: Not on file  Lifestyle  . Physical activity    Days per week: Not on file    Minutes per session: Not on file  . Stress: Not on file  Relationships  . Social Musicianconnections    Talks on phone: Not on file    Gets together: Not on file    Attends religious service: Not on file    Active member of club or organization: Not on file    Attends meetings of clubs or organizations: Not on file    Relationship status: Not on  file  . Intimate partner violence    Fear of current or ex partner: Not on file    Emotionally abused: Not on file    Physically abused: Not on file    Forced sexual activity: Not on file  Other Topics Concern  . Not on file  Social History Narrative  . Not on file    Outpatient Medications Prior to Visit  Medication Sig Dispense Refill  . hydrochlorothiazide (HYDRODIURIL) 25 MG tablet Take 1 tablet by mouth daily.    . potassium chloride SA (K-DUR,KLOR-CON) 20 MEQ tablet Take 1 tablet by mouth once daily 30 tablet 0  . chlorthalidone (HYGROTON) 25 MG tablet Take 1 tablet (25 mg total) by mouth daily for 30 days. 30 tablet 1   No facility-administered medications prior to visit.     No Known Allergies  ROS Review of Systems  Constitutional: Negative for chills, fatigue, fever and unexpected weight change.  HENT: Negative.   Eyes: Negative for photophobia and visual disturbance.  Respiratory: Negative.   Cardiovascular: Negative for chest pain and palpitations.  Gastrointestinal: Negative.   Endocrine: Negative for polyphagia and polyuria.  Genitourinary: Negative.   Musculoskeletal: Negative for gait problem and joint swelling.  Skin: Negative for  pallor and rash.  Allergic/Immunologic: Negative for immunocompromised state.  Neurological: Negative for light-headedness and numbness.  Hematological: Negative.   Psychiatric/Behavioral: Negative.       Objective:    Physical Exam  Constitutional: She is oriented to person, place, and time. She appears well-developed and well-nourished. No distress.  HENT:  Head: Normocephalic and atraumatic.  Right Ear: External ear normal.  Left Ear: External ear normal.  Mouth/Throat: Oropharynx is clear and moist. No oropharyngeal exudate.  Eyes: Pupils are equal, round, and reactive to light. Conjunctivae and EOM are normal. Right eye exhibits no discharge. Left eye exhibits no discharge. No scleral icterus.  Neck: No JVD present.  No tracheal deviation present. No thyromegaly present.  Cardiovascular: Normal rate, regular rhythm and normal heart sounds.  Pulmonary/Chest: Effort normal and breath sounds normal. No stridor.  Abdominal: Bowel sounds are normal.  Musculoskeletal:        General: No edema.  Lymphadenopathy:    She has no cervical adenopathy.  Neurological: She is alert and oriented to person, place, and time.  Skin: Skin is warm and dry. She is not diaphoretic.     Psychiatric: She has a normal mood and affect. Her behavior is normal.    BP 130/90   Pulse 76   Ht 5\' 11"  (1.803 m)   Wt 225 lb 4 oz (102.2 kg)   SpO2 98%   BMI 31.42 kg/m  Wt Readings from Last 3 Encounters:  10/05/18 225 lb 4 oz (102.2 kg)  05/31/17 225 lb (102.1 kg)   BP Readings from Last 3 Encounters:  10/05/18 130/90  05/31/17 136/90   Guideline developer:  UpToDate (see UpToDate for funding source) Date Released: June 2014  Health Maintenance Due  Topic Date Due  . Hepatitis C Screening  [birth year]  . TETANUS/TDAP  12/12/1976  . COLONOSCOPY  12/13/2007  . MAMMOGRAM  12/28/2012    There are no preventive care reminders to display for this patient.  Lab Results  Component Value Date   TSH 1.27 08/08/2018   Lab Results  Component Value Date   WBC 4.5 05/31/2017   HGB 14.7 05/31/2017   HCT 43.2 05/31/2017   MCV 95.4 05/31/2017   PLT 211.0 05/31/2017   Lab Results  Component Value Date   NA 137 05/31/2017   K 4.0 05/31/2017   CO2 32 05/31/2017   GLUCOSE 102 (H) 05/31/2017   BUN 17 05/31/2017   CREATININE 0.80 05/31/2017   BILITOT 0.5 05/31/2017   ALKPHOS 83 05/31/2017   AST 13 05/31/2017   ALT 10 05/31/2017   PROT 7.8 05/31/2017   ALBUMIN 4.3 05/31/2017   CALCIUM 10.2 05/31/2017   GFR 94.27 05/31/2017   Lab Results  Component Value Date   CHOL 217 (H) 05/31/2017   Lab Results  Component Value Date   HDL 85.30 05/31/2017   Lab Results  Component Value Date   LDLCALC 114 (H) 05/31/2017    Lab Results  Component Value Date   TRIG 91.0 05/31/2017   Lab Results  Component Value Date   CHOLHDL 3 05/31/2017   No results found for: HGBA1C    Assessment & Plan:   Problem List Items Addressed This Visit      Cardiovascular and Mediastinum   Essential hypertension - Primary   Relevant Medications   potassium chloride SA (K-DUR) 20 MEQ tablet   chlorthalidone (HYGROTON) 25 MG tablet   Blood Pressure Monitoring (BLOOD PRESSURE CUFF) MISC   Other Relevant Orders  Basic metabolic panel     Endocrine   Hyperthyroidism   Relevant Orders   TSH   T3   Thyroid stimulating immunoglobulin    Other Visit Diagnoses    Lichen simplex chronicus       Relevant Medications   triamcinolone ointment (KENALOG) 0.5 %      Meds ordered this encounter  Medications  . triamcinolone ointment (KENALOG) 0.5 %    Sig: Apply a small amount to rash on leg only daily until cleared.    Dispense:  30 g    Refill:  0  . potassium chloride SA (K-DUR) 20 MEQ tablet    Sig: Take 1 tablet (20 mEq total) by mouth daily.    Dispense:  90 tablet    Refill:  1    Needs follow up appt, last seen a year ago.  . chlorthalidone (HYGROTON) 25 MG tablet    Sig: Take 1 tablet (25 mg total) by mouth daily.    Dispense:  90 tablet    Refill:  1  . Blood Pressure Monitoring (BLOOD PRESSURE CUFF) MISC    Sig: As directed.    Dispense:  1 each    Refill:  0    Follow-up: Return in about 3 months (around 01/05/2019).

## 2018-10-10 LAB — T3: T3, Total: 90 ng/dL (ref 76–181)

## 2018-10-10 LAB — THYROID STIMULATING IMMUNOGLOBULIN: TSI: 129 % baseline (ref <140)

## 2018-10-11 ENCOUNTER — Other Ambulatory Visit: Payer: Self-pay

## 2018-10-11 DIAGNOSIS — I10 Essential (primary) hypertension: Secondary | ICD-10-CM

## 2018-10-11 MED ORDER — BLOOD PRESSURE CUFF MISC: 0 refills | Status: DC

## 2018-11-09 ENCOUNTER — Telehealth: Payer: Self-pay | Admitting: Family Medicine

## 2018-11-09 DIAGNOSIS — E0500 Thyrotoxicosis with diffuse goiter without thyrotoxic crisis or storm: Secondary | ICD-10-CM

## 2018-11-21 NOTE — Telephone Encounter (Signed)
Thyroid tests were all normal. Hold hyperthyroid meds for now. Will recheck at next ov.

## 2018-11-21 NOTE — Telephone Encounter (Signed)
Pt want to know the reason why this prescription was denied, pt was in the office in July. Please call pt to advise

## 2018-11-22 NOTE — Telephone Encounter (Signed)
I called and spoke with pt. We went over the information below & she verbalized understanding. Knows to follow up in October.

## 2019-02-05 ENCOUNTER — Telehealth: Payer: Self-pay

## 2019-02-05 NOTE — Telephone Encounter (Signed)

## 2019-02-06 ENCOUNTER — Encounter: Payer: Self-pay | Admitting: Family Medicine

## 2019-02-06 ENCOUNTER — Ambulatory Visit: Payer: BC Managed Care – PPO | Admitting: Family Medicine

## 2019-02-06 ENCOUNTER — Encounter: Payer: Self-pay | Admitting: Gastroenterology

## 2019-02-06 ENCOUNTER — Other Ambulatory Visit: Payer: Self-pay

## 2019-02-06 VITALS — BP 134/78 | HR 102 | Temp 97.5°F | Wt 220.4 lb

## 2019-02-06 DIAGNOSIS — I10 Essential (primary) hypertension: Secondary | ICD-10-CM

## 2019-02-06 DIAGNOSIS — Z0000 Encounter for general adult medical examination without abnormal findings: Secondary | ICD-10-CM | POA: Diagnosis not present

## 2019-02-06 DIAGNOSIS — E0500 Thyrotoxicosis with diffuse goiter without thyrotoxic crisis or storm: Secondary | ICD-10-CM

## 2019-02-06 DIAGNOSIS — L280 Lichen simplex chronicus: Secondary | ICD-10-CM

## 2019-02-06 LAB — COMPREHENSIVE METABOLIC PANEL
ALT: 18 U/L (ref 0–35)
AST: 22 U/L (ref 0–37)
Albumin: 4 g/dL (ref 3.5–5.2)
Alkaline Phosphatase: 72 U/L (ref 39–117)
BUN: 13 mg/dL (ref 6–23)
CO2: 28 mEq/L (ref 19–32)
Calcium: 9.6 mg/dL (ref 8.4–10.5)
Chloride: 99 mEq/L (ref 96–112)
Creatinine, Ser: 0.71 mg/dL (ref 0.40–1.20)
GFR: 101.21 mL/min (ref >60.00)
Glucose, Bld: 102 mg/dL — ABNORMAL HIGH (ref 70–99)
Potassium: 3.4 mEq/L — ABNORMAL LOW (ref 3.5–5.1)
Sodium: 136 mEq/L (ref 135–145)
Total Bilirubin: 0.5 mg/dL (ref 0.2–1.2)
Total Protein: 7.4 g/dL (ref 6.0–8.3)

## 2019-02-06 LAB — URINALYSIS, ROUTINE W REFLEX MICROSCOPIC
Bilirubin Urine: NEGATIVE
Ketones, ur: NEGATIVE
Leukocytes,Ua: NEGATIVE
Nitrite: NEGATIVE
Specific Gravity, Urine: 1.015 (ref 1.000–1.030)
Total Protein, Urine: NEGATIVE
Urine Glucose: NEGATIVE
Urobilinogen, UA: 1 (ref 0.0–1.0)
pH: 6.5 (ref 5.0–8.0)

## 2019-02-06 LAB — CBC
HCT: 42.1 % (ref 36.0–46.0)
Hemoglobin: 14.2 g/dL (ref 12.0–15.0)
MCHC: 33.7 g/dL (ref 30.0–36.0)
MCV: 97.3 fl (ref 78.0–100.0)
Platelets: 219 10*3/uL (ref 150.0–400.0)
RBC: 4.33 Mil/uL (ref 3.87–5.11)
RDW: 15.7 % — ABNORMAL HIGH (ref 11.5–15.5)
WBC: 4.6 10*3/uL (ref 4.0–10.5)

## 2019-02-06 MED ORDER — CHLORTHALIDONE 25 MG PO TABS: 25.0000 mg | ORAL_TABLET | Freq: Every day | ORAL | 1 refills | Status: DC

## 2019-02-06 MED ORDER — POTASSIUM CHLORIDE CRYS ER 20 MEQ PO TBCR: 20.0000 meq | EXTENDED_RELEASE_TABLET | Freq: Every day | ORAL | 1 refills | Status: DC

## 2019-02-06 NOTE — Progress Notes (Addendum)
Established Patient Office Visit  Subjective:  Patient ID: Tammy Booker, female    DOB: 10/14/1955  Age: 63 y.o. MRN: [id number]  CC:  Chief Complaint  Patient presents with  . Follow-up    Pt is here today for a F/U to repeat thyroid immunoglobulins per July lab results. She is UTD with immunizations. She is due for Hep C and Colonoscopy/Cologuard. She is also due for a Mammogram which was last completed at [place] and would be willing to have it done if ordered.Marland Kitchen    HPI Tammy Booker presents for follow-up of her hypertension, Graves' disease rash and health maintenance issues.  Continues to shelter at home alone.  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  Continues to take chlorthalidone and potassium without issue.  She has developed a rash on her arms that itches and she finds herself picking at it often.  It has been that there a couple of weeks.  She is due for a colonoscopy and mammogram.  She does not smoke.  She continues to enjoy 1 to 2 glasses of wine nightly and that is it.  History reviewed. No pertinent past medical history.  History reviewed. No pertinent surgical history.  History reviewed. No pertinent family history.  Social History   Socioeconomic History  . Marital status: Single    Spouse name: Not on file  . Number of children: Not on file  . Years of education: Not on file  . Highest education level: Not on file  Occupational History  . Not on file  Social Needs  . Financial resource strain: Not on file  . Food insecurity    Worry: Not on file    Inability: Not on file  . Transportation needs    Medical: Not on file    Non-medical: Not on file  Tobacco Use  . Smoking status: Never Smoker  . Smokeless tobacco: Never Used  Substance and Sexual Activity  . Alcohol use: Yes    Comment: 2 glasses of wine per day  . Drug use: Not Currently  . Sexual activity: Not on file  Lifestyle  . Physical activity    Days per week:  Not on file    Minutes per session: Not on file  . Stress: Not on file  Relationships  . Social Herbalist on phone: Not on file    Gets together: Not on file    Attends religious service: Not on file    Active member of club or organization: Not on file    Attends meetings of clubs or organizations: Not on file    Relationship status: Not on file  . Intimate partner violence    Fear of current or ex partner: Not on file    Emotionally abused: Not on file    Physically abused: Not on file    Forced sexual activity: Not on file  Other Topics Concern  . Not on file  Social History Narrative  . Not on file    Outpatient Medications Prior to Visit  Medication Sig Dispense Refill  . Blood Pressure Monitoring (BLOOD PRESSURE CUFF) MISC As directed. 1 each 0  . triamcinolone ointment (KENALOG) 0.5 % Apply a small amount to rash on leg only daily until cleared. 30 g 0  . chlorthalidone (HYGROTON) 25 MG tablet Take 1 tablet (25 mg total) by mouth daily. 90 tablet 1  . potassium chloride SA (K-DUR) 20 MEQ tablet  Take 1 tablet (20 mEq total) by mouth daily. 90 tablet 1   No facility-administered medications prior to visit.     No Known Allergies  ROS Review of Systems  Constitutional: Negative.   HENT: Negative.   Eyes: Negative for photophobia and visual disturbance.  Respiratory: Negative.   Cardiovascular: Negative.   Gastrointestinal: Negative.   Endocrine: Negative for polyphagia and polyuria.  Genitourinary: Negative.   Musculoskeletal: Negative for gait problem and joint swelling.  Skin: Positive for color change and rash.  Allergic/Immunologic: Negative for immunocompromised state.  Neurological: Negative for light-headedness and numbness.  Hematological: Does not bruise/bleed easily.  Psychiatric/Behavioral: Negative.       Objective:    Physical Exam  Constitutional: She is oriented to person, place, and time. She appears well-developed and  well-nourished. No distress.  HENT:  Head: Normocephalic and atraumatic.  Right Ear: External ear normal.  Left Ear: External ear normal.  Eyes: Conjunctivae are normal. Right eye exhibits no discharge. Left eye exhibits no discharge. No scleral icterus.  Neck: No JVD present. No tracheal deviation present.  Cardiovascular: Normal rate, regular rhythm and normal heart sounds.  Pulmonary/Chest: Effort normal and breath sounds normal. No stridor.  Abdominal: Bowel sounds are normal.  Musculoskeletal:        General: No edema.  Neurological: She is alert and oriented to person, place, and time.  Skin: Skin is warm and dry. She is not diaphoretic.     Psychiatric: She has a normal mood and affect. Her behavior is normal.    BP 134/78 (BP Location: Right Arm, Patient Position: Sitting, Cuff Size: Normal)   Pulse (!) 102   Temp (!) 97.5 F (36.4 C) (Oral)   Wt 220 lb 6.4 oz (100 kg)   SpO2 98%   BMI 30.74 kg/m  Wt Readings from Last 3 Encounters:  02/06/19 220 lb 6.4 oz (100 kg)  10/05/18 225 lb 4 oz (102.2 kg)  05/31/17 225 lb (102.1 kg)     Health Maintenance Due  Topic Date Due  . Hepatitis C Screening  May 13, 1955  . TETANUS/TDAP  12/12/1976  . COLONOSCOPY  12/13/2007  . MAMMOGRAM  12/28/2012    There are no preventive care reminders to display for this patient.  Lab Results  Component Value Date   TSH 0.60 10/05/2018   Lab Results  Component Value Date   WBC 4.6 02/06/2019   HGB 14.2 02/06/2019   HCT 42.1 02/06/2019   MCV 97.3 02/06/2019   PLT 219.0 02/06/2019   Lab Results  Component Value Date   NA 136 02/06/2019   K 3.4 (L) 02/06/2019   CO2 28 02/06/2019   GLUCOSE 102 (H) 02/06/2019   BUN 13 02/06/2019   CREATININE 0.71 02/06/2019   BILITOT 0.5 02/06/2019   ALKPHOS 72 02/06/2019   AST 22 02/06/2019   ALT 18 02/06/2019   PROT 7.4 02/06/2019   ALBUMIN 4.0 02/06/2019   CALCIUM 9.6 02/06/2019   GFR 101.21 02/06/2019   Lab Results  Component Value  Date   CHOL 217 (H) 05/31/2017   Lab Results  Component Value Date   HDL 85.30 05/31/2017   Lab Results  Component Value Date   LDLCALC 114 (H) 05/31/2017   Lab Results  Component Value Date   TRIG 91.0 05/31/2017   Lab Results  Component Value Date   CHOLHDL 3 05/31/2017   No results found for: HGBA1C    Assessment & Plan:   Problem List Items Addressed This Visit  Cardiovascular and Mediastinum   Essential hypertension   Relevant Medications   chlorthalidone (HYGROTON) 25 MG tablet   potassium chloride SA (KLOR-CON) 20 MEQ tablet   Other Relevant Orders   Comp Met (CMET) (Completed)   CBC (Completed)   Urinalysis, Routine w reflex microscopic (Completed)     Endocrine   Graves disease   Relevant Orders   Thyroid stimulating immunoglobulin    Other Visit Diagnoses    Lichen simplex chronicus    -  Primary   Relevant Orders   Ambulatory referral to Dermatology   Healthcare maintenance       Relevant Orders   Hepatitis C Antibody   Ambulatory referral to Gastroenterology   MM Digital Screening      Meds ordered this encounter  Medications  . chlorthalidone (HYGROTON) 25 MG tablet    Sig: Take 1 tablet (25 mg total) by mouth daily.    Dispense:  90 tablet    Refill:  1  . potassium chloride SA (KLOR-CON) 20 MEQ tablet    Sig: Take 1 tablet (20 mEq total) by mouth daily.    Dispense:  90 tablet    Refill:  1    Needs follow up appt, last seen a year ago.    Follow-up: Return in about 3 months (around 05/09/2019).    Libby Maw, MD

## 2019-02-07 NOTE — Addendum Note (Signed)
Addended by: Nathanial Millman E on: 02/07/2019 03:07 PM   Modules accepted: Orders

## 2019-02-12 LAB — THYROID STIMULATING IMMUNOGLOBULIN: TSI: 109 % baseline (ref <140)

## 2019-02-12 LAB — HEPATITIS C ANTIBODY
Hepatitis C Ab: NONREACTIVE
SIGNAL TO CUT-OFF: 0.08 (ref <1.00)

## 2019-02-13 ENCOUNTER — Telehealth: Payer: Self-pay | Admitting: Family Medicine

## 2019-02-13 NOTE — Telephone Encounter (Signed)
Copied from [place] [phone]. Topic: General - Other >> Feb 13, 2019 11:46 AM Keene Breath wrote: Reason for CRM: Patient called to ask the nurse or doctor to call her in some medication for her itching and rash.  She doesn't have a dermatologist appt. Until the end of January and she does not feel she can wait that long.  Please advise and call patient to discuss at [phone]

## 2019-02-13 NOTE — Telephone Encounter (Signed)
Pt called and stated that she was seen in the office for itching on legs. Pt states that she has not has a call from dermatology and would like to know if something else could be called in for the itching. Please advise

## 2019-02-14 MED ORDER — TRIAMCINOLONE ACETONIDE 0.5 % EX OINT: TOPICAL_OINTMENT | CUTANEOUS | 0 refills | Status: DC

## 2019-02-14 NOTE — Addendum Note (Signed)
Addended by: Jon Billings on: 02/14/2019 08:46 AM   Modules accepted: Orders

## 2019-02-14 NOTE — Telephone Encounter (Signed)
I spoke with pt. She is aware Rx was sent in & that new referral was placed.

## 2019-02-14 NOTE — Telephone Encounter (Signed)
Have sent a med to the pharmacy for her and re referred her to Dermatology.

## 2019-02-19 ENCOUNTER — Ambulatory Visit (HOSPITAL_BASED_OUTPATIENT_CLINIC_OR_DEPARTMENT_OTHER)
Admission: RE | Admit: 2019-02-19 | Discharge: 2019-02-19 | Disposition: A | Discharge status: Home / Self Care | Payer: BC Managed Care – PPO | Source: Ambulatory Visit | Attending: Family Medicine | Admitting: Family Medicine

## 2019-02-19 ENCOUNTER — Other Ambulatory Visit: Payer: Self-pay

## 2019-02-19 ENCOUNTER — Encounter (HOSPITAL_BASED_OUTPATIENT_CLINIC_OR_DEPARTMENT_OTHER): Payer: Self-pay

## 2019-02-19 DIAGNOSIS — Z0000 Encounter for general adult medical examination without abnormal findings: Secondary | ICD-10-CM

## 2019-02-19 DIAGNOSIS — Z1231 Encounter for screening mammogram for malignant neoplasm of breast: Secondary | ICD-10-CM | POA: Insufficient documentation

## 2019-02-21 ENCOUNTER — Encounter: Payer: BC Managed Care – PPO | Admitting: Gastroenterology

## 2019-05-07 ENCOUNTER — Other Ambulatory Visit: Payer: Self-pay

## 2019-05-08 ENCOUNTER — Encounter: Payer: Self-pay | Admitting: Family Medicine

## 2019-05-08 ENCOUNTER — Ambulatory Visit (INDEPENDENT_AMBULATORY_CARE_PROVIDER_SITE_OTHER): Payer: BC Managed Care – PPO | Admitting: Family Medicine

## 2019-05-08 VITALS — BP 138/82 | HR 78 | Temp 95.8°F | Ht 71.0 in | Wt 216.4 lb

## 2019-05-08 DIAGNOSIS — L280 Lichen simplex chronicus: Secondary | ICD-10-CM

## 2019-05-08 DIAGNOSIS — I10 Essential (primary) hypertension: Secondary | ICD-10-CM

## 2019-05-08 DIAGNOSIS — E0500 Thyrotoxicosis with diffuse goiter without thyrotoxic crisis or storm: Secondary | ICD-10-CM | POA: Diagnosis not present

## 2019-05-08 DIAGNOSIS — E876 Hypokalemia: Secondary | ICD-10-CM

## 2019-05-08 DIAGNOSIS — E0590 Thyrotoxicosis, unspecified without thyrotoxic crisis or storm: Secondary | ICD-10-CM

## 2019-05-08 LAB — BASIC METABOLIC PANEL
BUN: 15 mg/dL (ref 6–23)
CO2: 30 mEq/L (ref 19–32)
Calcium: 9.8 mg/dL (ref 8.4–10.5)
Chloride: 99 mEq/L (ref 96–112)
Creatinine, Ser: 0.71 mg/dL (ref 0.40–1.20)
GFR: 101.13 mL/min (ref >60.00)
Glucose, Bld: 103 mg/dL — ABNORMAL HIGH (ref 70–99)
Potassium: 3.7 mEq/L (ref 3.5–5.1)
Sodium: 137 mEq/L (ref 135–145)

## 2019-05-08 LAB — TSH: TSH: <0.01 u[IU]/mL — ABNORMAL LOW (ref 0.35–4.50)

## 2019-05-08 NOTE — Progress Notes (Signed)
Established Patient Office Visit  Subjective:  Patient ID: Tammy Booker, female    DOB: 18-Sep-1955  Age: 64 y.o. MRN: [id number]  CC:  Chief Complaint  Patient presents with  . Follow-up    3 month follow up on Graves disease rash and BP, pt states that the rash is still itchy and it leaves a mark when it heals.     HPI Tammy Booker presents for follow-up of her blood pressure, lichen simplex chronicus and Graves' disease.  Patient admits that she has not been taking her potassium pill daily and sometimes forgets it.  Blood pressure appears to be controlled with the chlorthalidone.  She is seeing the dermatologist who is talked her about xerosis and the problems with strong steroid use and their effect on the skin.  Denies constipation palpitations but has had some hair loss.  History reviewed. No pertinent past medical history.  History reviewed. No pertinent surgical history.  History reviewed. No pertinent family history.  Social History   Socioeconomic History  . Marital status: Single    Spouse name: Not on file  . Number of children: Not on file  . Years of education: Not on file  . Highest education level: Not on file  Occupational History  . Not on file  Tobacco Use  . Smoking status: Never Smoker  . Smokeless tobacco: Never Used  Substance and Sexual Activity  . Alcohol use: Yes    Comment: 2 glasses of wine per day  . Drug use: Not Currently  . Sexual activity: Not on file  Other Topics Concern  . Not on file  Social History Narrative  . Not on file   Social Determinants of Health   Financial Resource Strain:   . Difficulty of Paying Living Expenses: Not on file  Food Insecurity:   . Worried About Programme researcher, broadcasting/film/video in the Last Year: Not on file  . Ran Out of Food in the Last Year: Not on file  Transportation Needs:   . Lack of Transportation (Medical): Not on file  . Lack of Transportation (Non-Medical): Not on file  Physical Activity:   . Days of  Exercise per Week: Not on file  . Minutes of Exercise per Session: Not on file  Stress:   . Feeling of Stress : Not on file  Social Connections:   . Frequency of Communication with Friends and Family: Not on file  . Frequency of Social Gatherings with Friends and Family: Not on file  . Attends Religious Services: Not on file  . Active Member of Clubs or Organizations: Not on file  . Attends Banker Meetings: Not on file  . Marital Status: Not on file  Intimate Partner Violence:   . Fear of Current or Ex-Partner: Not on file  . Emotionally Abused: Not on file  . Physically Abused: Not on file  . Sexually Abused: Not on file    Outpatient Medications Prior to Visit  Medication Sig Dispense Refill  . Blood Pressure Monitoring (BLOOD PRESSURE CUFF) MISC As directed. 1 each 0  . chlorthalidone (HYGROTON) 25 MG tablet Take 1 tablet (25 mg total) by mouth daily. 90 tablet 1  . clobetasol ointment (TEMOVATE) 0.05 % Apply 60 application topically 2 (two) times daily.    . potassium chloride SA (KLOR-CON) 20 MEQ tablet Take 1 tablet (20 mEq total) by mouth daily. 90 tablet 1  . triamcinolone ointment (KENALOG) 0.5 % Apply a small amount to rash on legs  daily. 30 g 0   No facility-administered medications prior to visit.    No Known Allergies  ROS Review of Systems  Constitutional: Negative.   Respiratory: Negative.   Cardiovascular: Negative.   Gastrointestinal: Negative.   Endocrine: Negative for cold intolerance and heat intolerance.  Genitourinary: Negative.   Skin: Positive for rash.  Neurological: Negative for headaches.  Hematological: Does not bruise/bleed easily.  Psychiatric/Behavioral: Negative.       Objective:    Physical Exam  Constitutional: She is oriented to person, place, and time. She appears well-developed and well-nourished. No distress.  HENT:  Head: Normocephalic and atraumatic.  Right Ear: External ear normal.  Left Ear: External ear  normal.  Eyes: Right eye exhibits no discharge. Left eye exhibits no discharge. No scleral icterus.  Neck: No JVD present. No tracheal deviation present.  Cardiovascular: Normal rate, regular rhythm and normal heart sounds.  Pulmonary/Chest: Effort normal and breath sounds normal. No stridor.  Neurological: She is alert and oriented to person, place, and time.  Skin: Skin is warm and dry. She is not diaphoretic.     Psychiatric: She has a normal mood and affect. Her behavior is normal.    BP 138/82   Pulse 78   Temp (!) 95.8 F (35.4 C) (Tympanic)   Ht 5\' 11"  (1.803 m)   Wt 216 lb 6.4 oz (98.2 kg)   SpO2 96%   BMI 30.18 kg/m  Wt Readings from Last 3 Encounters:  05/08/19 216 lb 6.4 oz (98.2 kg)  02/06/19 220 lb 6.4 oz (100 kg)  10/05/18 225 lb 4 oz (102.2 kg)     Health Maintenance Due  Topic Date Due  . TETANUS/TDAP  12/12/1976  . COLONOSCOPY  12/13/2007  . PAP SMEAR-Modifier  04/14/2019    There are no preventive care reminders to display for this patient.  Lab Results  Component Value Date   TSH 0.60 10/05/2018   Lab Results  Component Value Date   WBC 4.6 02/06/2019   HGB 14.2 02/06/2019   HCT 42.1 02/06/2019   MCV 97.3 02/06/2019   PLT 219.0 02/06/2019   Lab Results  Component Value Date   NA 136 02/06/2019   K 3.4 (L) 02/06/2019   CO2 28 02/06/2019   GLUCOSE 102 (H) 02/06/2019   BUN 13 02/06/2019   CREATININE 0.71 02/06/2019   BILITOT 0.5 02/06/2019   ALKPHOS 72 02/06/2019   AST 22 02/06/2019   ALT 18 02/06/2019   PROT 7.4 02/06/2019   ALBUMIN 4.0 02/06/2019   CALCIUM 9.6 02/06/2019   GFR 101.21 02/06/2019   Lab Results  Component Value Date   CHOL 217 (H) 05/31/2017   Lab Results  Component Value Date   HDL 85.30 05/31/2017   Lab Results  Component Value Date   LDLCALC 114 (H) 05/31/2017   Lab Results  Component Value Date   TRIG 91.0 05/31/2017   Lab Results  Component Value Date   CHOLHDL 3 05/31/2017   No results found  for: HGBA1C    Assessment & Plan:   Problem List Items Addressed This Visit      Cardiovascular and Mediastinum   Essential hypertension - Primary   Relevant Orders   Basic metabolic panel     Endocrine   Hyperthyroidism   Relevant Orders   Thyroid stimulating immunoglobulin   Graves disease   Relevant Orders   TSH   Thyroid stimulating immunoglobulin     Musculoskeletal and Integument   Lichen simplex chronicus  Other   Hypokalemia   Relevant Orders   Basic metabolic panel      No orders of the defined types were placed in this encounter.   Follow-up: Return in about 3 months (around 08/05/2019), or Please take your potassium daily. May need to switch to different blood pressure medicine.Marland Kitchen  She will follow-up with dermatology for her lichen simplex chronicus.  Mliss Sax, MD

## 2019-05-10 LAB — THYROID STIMULATING IMMUNOGLOBULIN: TSI: 94 % baseline (ref <140)

## 2019-10-31 ENCOUNTER — Other Ambulatory Visit: Payer: Self-pay | Admitting: Family Medicine

## 2019-10-31 DIAGNOSIS — I10 Essential (primary) hypertension: Secondary | ICD-10-CM

## 2020-01-11 LAB — COLOGUARD

## 2020-02-11 ENCOUNTER — Telehealth: Payer: Self-pay | Admitting: Family Medicine

## 2020-02-11 NOTE — Telephone Encounter (Signed)
Patient is calling to see what she needs to do to get the Cologuard Kit. Please call her back at [phone] and advise.

## 2020-02-22 ENCOUNTER — Other Ambulatory Visit: Payer: Self-pay | Admitting: Family Medicine

## 2020-02-22 DIAGNOSIS — I10 Essential (primary) hypertension: Secondary | ICD-10-CM

## 2020-02-25 ENCOUNTER — Other Ambulatory Visit: Payer: Self-pay

## 2020-02-25 DIAGNOSIS — I10 Essential (primary) hypertension: Secondary | ICD-10-CM

## 2020-02-25 MED ORDER — BLOOD PRESSURE CUFF MISC: 0 refills | Status: DC

## 2020-02-25 NOTE — Telephone Encounter (Signed)
Pt called back stating she spoke to [place] earlier about blood pressure cuff. She called [facility] and they need a new order. Please fax and notify pt.

## 2020-02-25 NOTE — Telephone Encounter (Signed)
Patient will wait till her appointment in January due to she will have to sign another form since last test that was sent to her since didn't date the test so it wasn't preformed.  Dm/cma

## 2020-02-25 NOTE — Telephone Encounter (Signed)
rx sent to [facility] through Epic.   Patient notified VIA phone. Dm/cma

## 2020-04-07 ENCOUNTER — Telehealth: Payer: Self-pay | Admitting: Family Medicine

## 2020-04-07 NOTE — Telephone Encounter (Signed)
Patient states that [facility] never received her prescription for her Blood pressure cuff. She also needs a refill on her nasal spray for her sinus issues. If approved, please send to [facility] on [address] and call her at [phone] to let her know that it has been sent in.

## 2020-04-17 ENCOUNTER — Ambulatory Visit: Payer: BC Managed Care – PPO | Admitting: Family Medicine

## 2020-05-21 ENCOUNTER — Ambulatory Visit: Payer: BC Managed Care – PPO | Admitting: Family Medicine

## 2020-06-02 ENCOUNTER — Other Ambulatory Visit: Payer: Self-pay

## 2020-06-03 ENCOUNTER — Ambulatory Visit: Payer: Self-pay | Admitting: Family Medicine

## 2020-06-08 ENCOUNTER — Other Ambulatory Visit: Payer: Self-pay | Admitting: Family

## 2020-06-08 DIAGNOSIS — I10 Essential (primary) hypertension: Secondary | ICD-10-CM

## 2020-06-10 ENCOUNTER — Encounter: Payer: Self-pay | Admitting: Family Medicine

## 2020-06-10 ENCOUNTER — Ambulatory Visit: Payer: BC Managed Care – PPO | Admitting: Family Medicine

## 2020-06-10 ENCOUNTER — Other Ambulatory Visit: Payer: Self-pay

## 2020-06-10 VITALS — BP 156/90 | HR 62 | Temp 97.3°F | Ht 71.0 in | Wt 220.8 lb

## 2020-06-10 DIAGNOSIS — I10 Essential (primary) hypertension: Secondary | ICD-10-CM | POA: Diagnosis not present

## 2020-06-10 DIAGNOSIS — L280 Lichen simplex chronicus: Secondary | ICD-10-CM

## 2020-06-10 DIAGNOSIS — J3089 Other allergic rhinitis: Secondary | ICD-10-CM

## 2020-06-10 DIAGNOSIS — E0500 Thyrotoxicosis with diffuse goiter without thyrotoxic crisis or storm: Secondary | ICD-10-CM | POA: Diagnosis not present

## 2020-06-10 DIAGNOSIS — H0012 Chalazion right lower eyelid: Secondary | ICD-10-CM

## 2020-06-10 DIAGNOSIS — J309 Allergic rhinitis, unspecified: Secondary | ICD-10-CM | POA: Insufficient documentation

## 2020-06-10 MED ORDER — CHLORTHALIDONE 25 MG PO TABS: 25.0000 mg | ORAL_TABLET | Freq: Every day | ORAL | 3 refills | Status: DC

## 2020-06-10 MED ORDER — POTASSIUM CHLORIDE CRYS ER 20 MEQ PO TBCR: EXTENDED_RELEASE_TABLET | ORAL | 3 refills | Status: DC

## 2020-06-10 MED ORDER — CLOBETASOL PROPIONATE 0.05 % EX OINT: 60.0000 "application " | TOPICAL_OINTMENT | Freq: Two times a day (BID) | CUTANEOUS | 1 refills | Status: DC

## 2020-06-10 MED ORDER — FLUTICASONE PROPIONATE 50 MCG/ACT NA SUSP: 2.0000 | Freq: Every day | NASAL | 6 refills | Status: DC

## 2020-06-10 NOTE — Progress Notes (Signed)
[facility] [address] [place] [address] Dept: [phone] Dept Fax: [phone]  Chronic Care Office Visit  Subjective:    Patient ID: Tammy Booker, female    DOB: [birth year], 65 y.o..   MRN: [id number]  Chief Complaint  Patient presents with  . Follow-up    F/u HTN/med refills on chorthalidone and potassium.  Also c/o having a stye on the RT eyelid x 1 year.      History of Present Illness:  Patient is in today for reassessment of chronic medical issues. Ms. Tammy Booker has a history of Graves disease. She is not currently on any medication for this. She denies any tremor, palpitations, or eye issues.  Ms. Tammy Booker has a history of hypertension. she notes that she had run out of her chlorthalidone. She typically takes a potassium supplement as well.  Ms. Tammy Booker notes the presence of a bump in her lower right eyelid for about the past year. This is not tender, but is bothersome. She wonders if there is a cream she could apply to get this to go away.  Ms. Tammy Booker has a history of seasonal allergic rhinitis, usually associated with exposure to cut grass. She periodically uses Flonase with good response.  Ms. Tammy Booker has a history of a rash, primarily on the arms. The dermatologist has diagnosed her with lichen simplex chronicus. She notes she does get occasional flares, which respond to the cream.  Past Medical History: Patient Active Problem List   Diagnosis Date Noted  . Allergic rhinitis 06/10/2020  . Chalazion of right lower eyelid 06/10/2020  . Hypokalemia 05/08/2019  . Lichen simplex chronicus 05/08/2019  . Non-compliant patient 07/31/2018  . Hyperthyroidism 05/31/2017  . Essential hypertension 05/31/2017  . Health care maintenance 05/31/2017  . Graves disease 05/31/2017   No past surgical history on file.  No family history on file.  Outpatient Medications Prior to Visit  Medication Sig Dispense Refill  . Blood  Pressure Monitoring (BLOOD PRESSURE CUFF) MISC As directed. 1 each 0  . chlorthalidone (HYGROTON) 25 MG tablet Take 1 tablet by mouth once daily 90 tablet 0  . clobetasol ointment (TEMOVATE) 0.05 % Apply 60 application topically 2 (two) times daily.    . potassium chloride SA (KLOR-CON) 20 MEQ tablet TAKE 1 TABLET BY MOUTH ONCE DAILY NEEDS  FOLLOW  UP  APPOINTMENT,  LAST  SEEN  A  YEAR  AGO 90 tablet 0  . triamcinolone ointment (KENALOG) 0.5 % Apply a small amount to rash on legs daily. (Patient not taking: Reported on 06/10/2020) 30 g 0   No facility-administered medications prior to visit.   No Known Allergies    Objective:   Today's Vitals   06/10/20 0948  BP: (!) 156/90  Pulse: 62  Temp: (!) 97.3 F (36.3 C)  TempSrc: Temporal  SpO2: 99%  Weight: 220 lb 12.8 oz (100.2 kg)  Height: 5\' 11"  (1.803 m)   Body mass index is 30.8 kg/m.   General: Well developed, well nourished. No acute distress. Eye: There is a small raised lesion towards the outer margin of the right lower lid. No pustule or drainage noted. Skin: There are multiple irregular patches of maculopapular rash with darkening on the forarms. Psych: Alert and oriented. Normal mood and affect.  Health Maintenance Due  Topic Date Due  . COVID-19 Vaccine (1) Never done  . TETANUS/TDAP  Never done  . COLONOSCOPY (Pts 45-21yrs Insurance coverage will need to be confirmed)  Never  done  . PAP SMEAR-Modifier  04/14/2019  . INFLUENZA VACCINE  10/28/2019     Assessment & Plan:   1. Essential hypertension Blood pressure is high today, off of meds. We will restart the chlorthalidone. I would also like to check a BMP to make sure her potassium level is doing okay and reassess renal function.  - chlorthalidone (HYGROTON) 25 MG tablet; Take 1 tablet (25 mg total) by mouth daily.  Dispense: 90 tablet; Refill: 3 - potassium chloride SA (KLOR-CON) 20 MEQ tablet; TAKE 1 TABLET BY MOUTH ONCE DAILY  Dispense: 90 tablet; Refill: 3 -  Basic metabolic panel; Future  2. Graves disease Stable for now. Will continue to monitor.  3. Lichen simplex chronicus Stable, under control. I will renew her Temovate ointment.  - clobetasol ointment (TEMOVATE) 0.05 %; Apply 60 application topically 2 (two) times daily.  Dispense: 30 g; Refill: 1  4. Seasonal allergic rhinitis due to other allergic trigger I will renew her Flonase, with the onset of the spring allergy season.  - fluticasone (FLONASE) 50 MCG/ACT nasal spray; Place 2 sprays into both nostrils daily.  Dispense: 16 g; Refill: 6  5. Chalazion of right lower eyelid Discussed with Ms. Tammy Booker about the chronic granulomatous nature of this condition. I explained that topical medications would not contribute to resolution of this. I recommend we refer her to ophthalmology to consider incision & curettage or steroid injection.  - Ambulatory referral to Ophthalmology  Loyola Mast, MD

## 2020-06-17 ENCOUNTER — Other Ambulatory Visit: Payer: BC Managed Care – PPO

## 2020-06-19 ENCOUNTER — Other Ambulatory Visit: Payer: Self-pay

## 2020-06-19 ENCOUNTER — Other Ambulatory Visit (INDEPENDENT_AMBULATORY_CARE_PROVIDER_SITE_OTHER): Payer: BC Managed Care – PPO

## 2020-06-19 DIAGNOSIS — I10 Essential (primary) hypertension: Secondary | ICD-10-CM | POA: Diagnosis not present

## 2020-06-19 LAB — BASIC METABOLIC PANEL
BUN: 15 mg/dL (ref 6–23)
CO2: 33 mEq/L — ABNORMAL HIGH (ref 19–32)
Calcium: 9.9 mg/dL (ref 8.4–10.5)
Chloride: 97 mEq/L (ref 96–112)
Creatinine, Ser: 0.85 mg/dL (ref 0.40–1.20)
GFR: 73.38 mL/min (ref >60.00)
Glucose, Bld: 110 mg/dL — ABNORMAL HIGH (ref 70–99)
Potassium: 3.7 mEq/L (ref 3.5–5.1)
Sodium: 138 mEq/L (ref 135–145)

## 2020-08-27 IMAGING — MG DIGITAL SCREENING BILAT W/ TOMO W/ CAD
8 of 14 series · 8 of 40 positions shown · non-contrast
Comparison: Previous exam(s).

CLINICAL DATA: Screening.

EXAM:
DIGITAL SCREENING BILATERAL MAMMOGRAM WITH TOMO AND CAD

[R XCCL synth-2D]
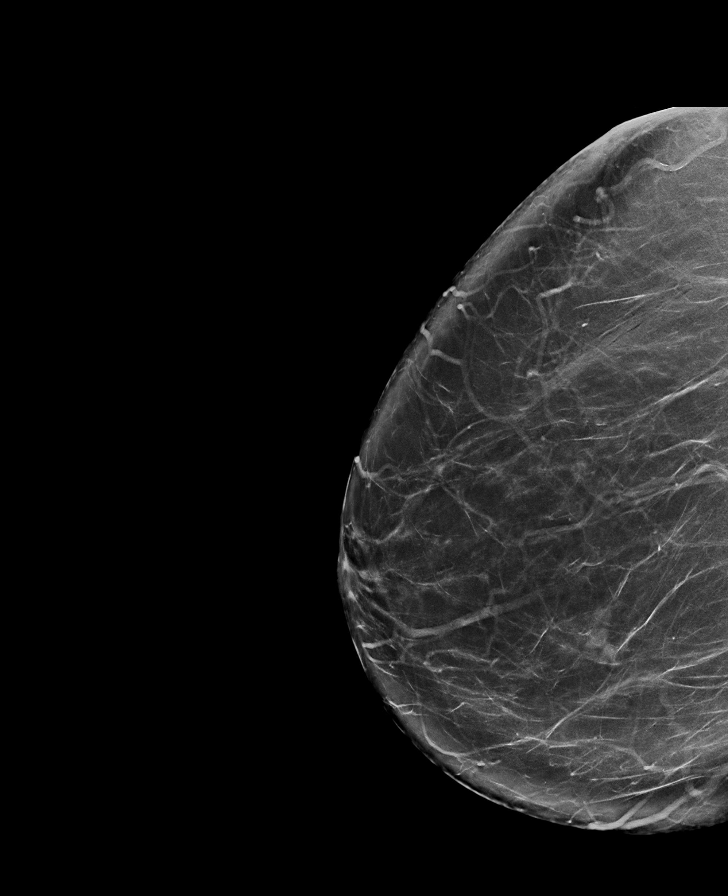

[R MLO synth-2D (1 of 2)]
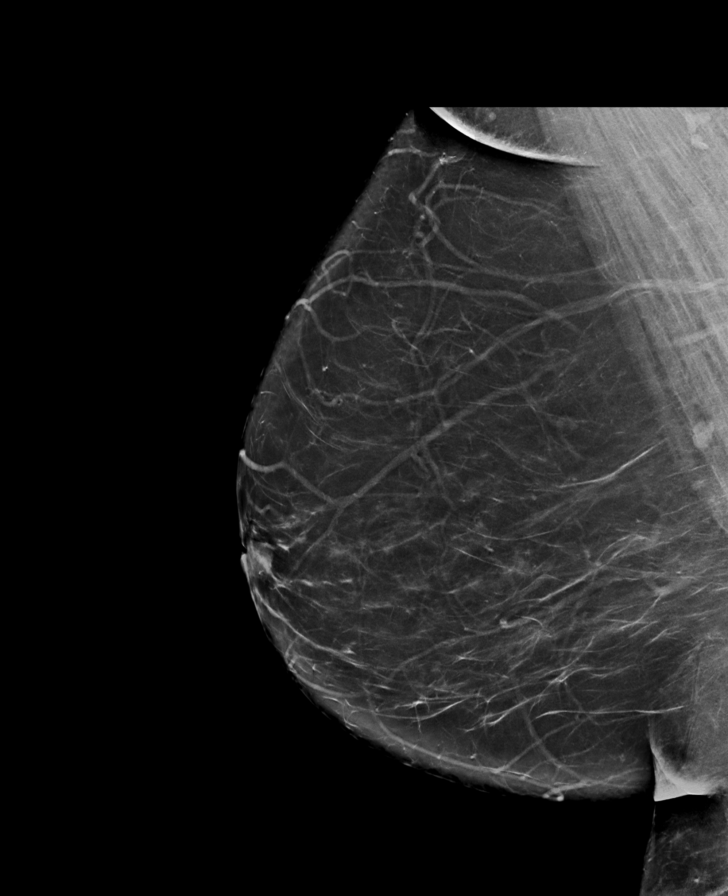

[R MLO synth-2D (2 of 2)]
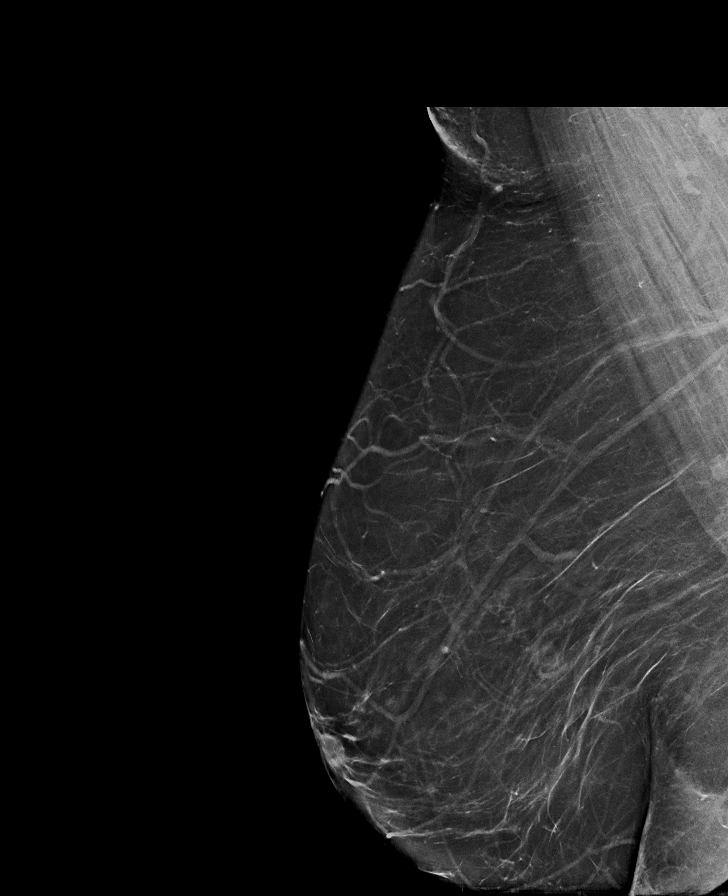

[L CC synth-2D]
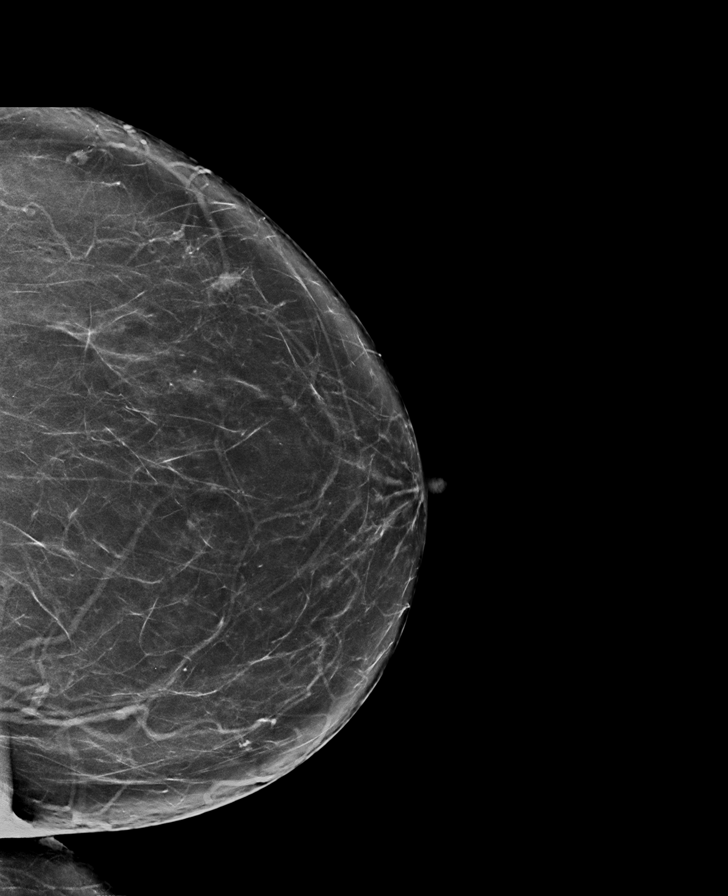

[L MLO synth-2D (1 of 2)]
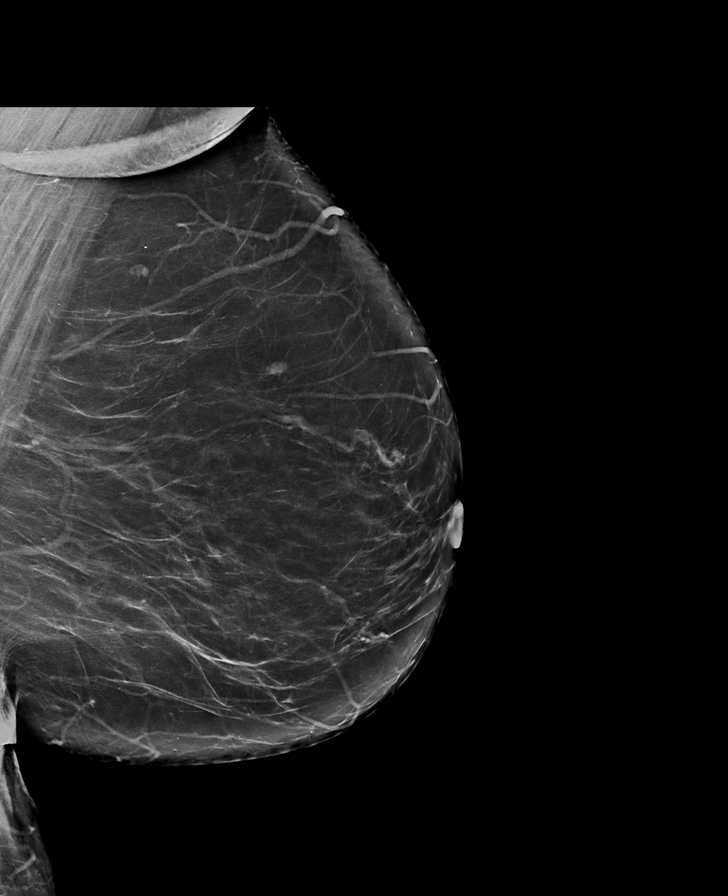

[L MLO synth-2D (2 of 2)]
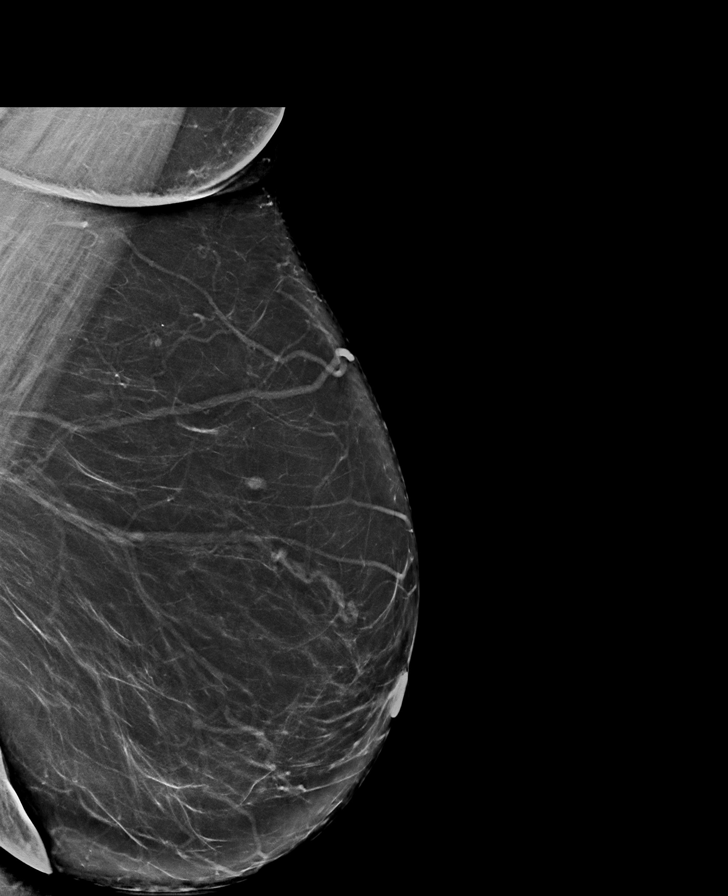

[R CC synth-2D]
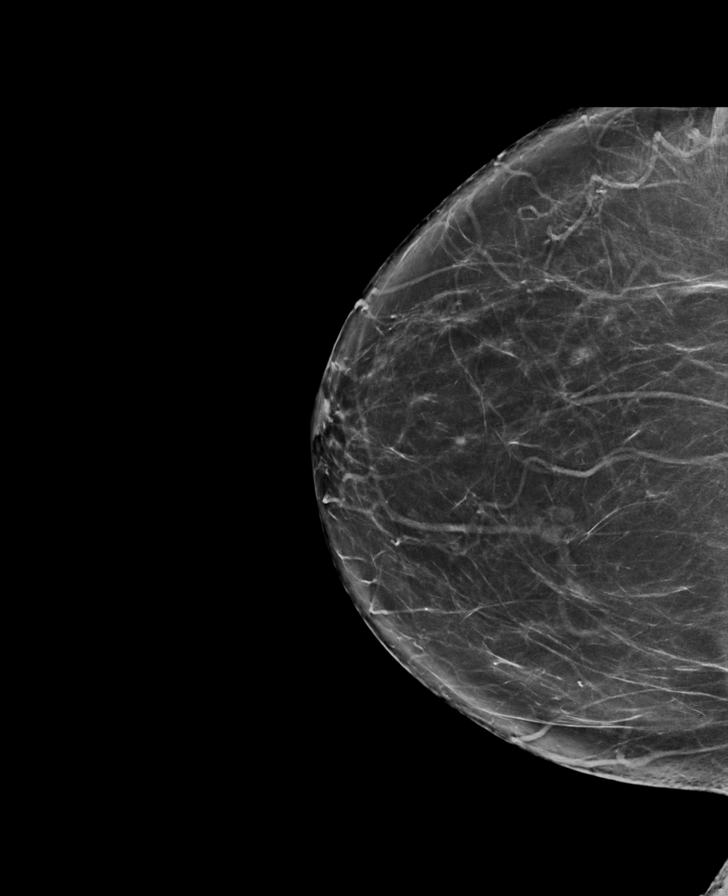

[R MLO tomo · tomo slice 45/89.0]
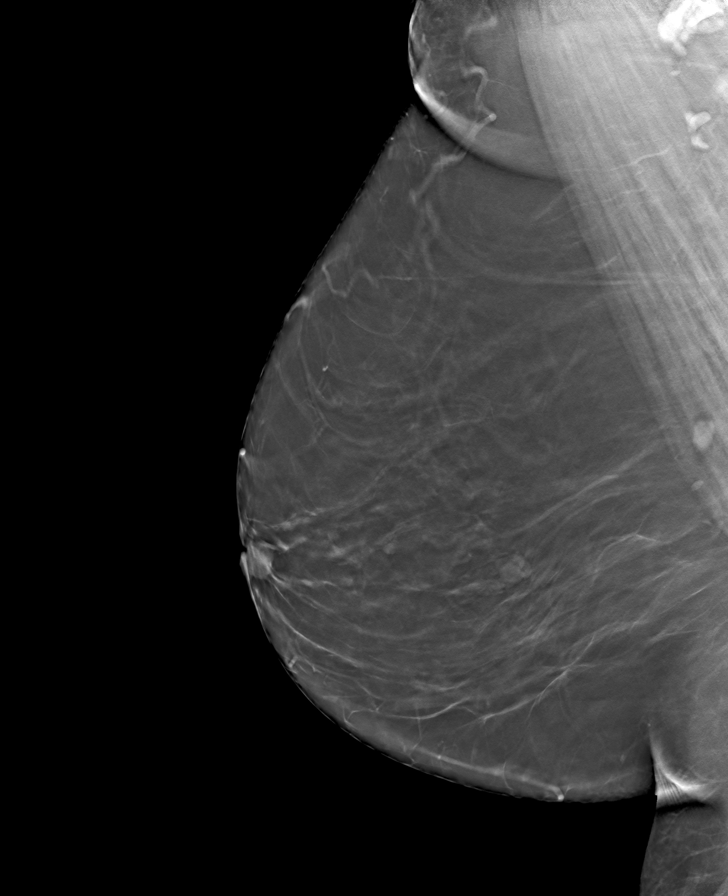

[8 of 40 positions shown; findings below may reference images not displayed]

ACR Breast Density Category b: There are scattered areas of
fibroglandular density.
FINDINGS: There are no findings suspicious for malignancy. Images were
processed with CAD.
IMPRESSION: No mammographic evidence of malignancy. A result letter of this
screening mammogram will be mailed directly to the patient.

RECOMMENDATION:
Screening mammogram in one year. (Code:CN-U-775)

BI-RADS CATEGORY  1: Negative.

## 2020-09-11 ENCOUNTER — Ambulatory Visit: Payer: BC Managed Care – PPO | Admitting: Family Medicine

## 2020-09-17 ENCOUNTER — Ambulatory Visit: Payer: BC Managed Care – PPO | Admitting: Family Medicine

## 2020-09-17 ENCOUNTER — Other Ambulatory Visit: Payer: Self-pay

## 2020-09-17 ENCOUNTER — Encounter: Payer: Self-pay | Admitting: Family Medicine

## 2020-09-17 VITALS — BP 140/76 | HR 80 | Temp 97.5°F | Ht 71.0 in | Wt 223.2 lb

## 2020-09-17 DIAGNOSIS — I10 Essential (primary) hypertension: Secondary | ICD-10-CM | POA: Diagnosis not present

## 2020-09-17 DIAGNOSIS — E0500 Thyrotoxicosis with diffuse goiter without thyrotoxic crisis or storm: Secondary | ICD-10-CM

## 2020-09-17 DIAGNOSIS — E0590 Thyrotoxicosis, unspecified without thyrotoxic crisis or storm: Secondary | ICD-10-CM

## 2020-09-17 LAB — BASIC METABOLIC PANEL
BUN: 19 mg/dL (ref 6–23)
CO2: 35 mEq/L — ABNORMAL HIGH (ref 19–32)
Calcium: 9.5 mg/dL (ref 8.4–10.5)
Chloride: 98 mEq/L (ref 96–112)
Creatinine, Ser: 0.83 mg/dL (ref 0.40–1.20)
GFR: 75.37 mL/min (ref >60.00)
Glucose, Bld: 93 mg/dL (ref 70–99)
Potassium: 3.9 mEq/L (ref 3.5–5.1)
Sodium: 139 mEq/L (ref 135–145)

## 2020-09-17 LAB — CBC
HCT: 42.9 % (ref 36.0–46.0)
Hemoglobin: 14.7 g/dL (ref 12.0–15.0)
MCHC: 34.3 g/dL (ref 30.0–36.0)
MCV: 98.1 fl (ref 78.0–100.0)
Platelets: 223 10*3/uL (ref 150.0–400.0)
RBC: 4.37 Mil/uL (ref 3.87–5.11)
RDW: 15.1 % (ref 11.5–15.5)
WBC: 4.7 10*3/uL (ref 4.0–10.5)

## 2020-09-17 LAB — URINALYSIS, ROUTINE W REFLEX MICROSCOPIC
Bilirubin Urine: NEGATIVE
Ketones, ur: NEGATIVE
Leukocytes,Ua: NEGATIVE
Nitrite: NEGATIVE
Specific Gravity, Urine: 1.015 (ref 1.000–1.030)
Total Protein, Urine: NEGATIVE
Urine Glucose: NEGATIVE
Urobilinogen, UA: 0.2 (ref 0.0–1.0)
pH: 6 (ref 5.0–8.0)

## 2020-09-17 LAB — T3, FREE: T3, Free: 3.8 pg/mL (ref 2.3–4.2)

## 2020-09-17 LAB — TSH: TSH: <0.01 u[IU]/mL — ABNORMAL LOW (ref 0.35–4.50)

## 2020-09-17 MED ORDER — METOPROLOL SUCCINATE ER 25 MG PO TB24: 25.0000 mg | ORAL_TABLET | Freq: Every day | ORAL | 1 refills | Status: DC

## 2020-09-17 MED ORDER — COMFORT TOUCH BP CUFF/MEDIUM MISC: 1.0000 [IU] | Status: AC

## 2020-09-17 NOTE — Progress Notes (Signed)
Established Patient Office Visit  Subjective:  Patient ID: Tammy Booker, female    DOB: [birth year]  Age: 65 y.o. MRN: [id number]  CC:  Chief Complaint  Patient presents with   Follow-up    3 month follow up on BP no concerns. Patient not fasting.     HPI Tammy Booker presents for follow-up of hypertension, hypothyroidism and July exam.  Status post recent extraction of chalazion.  She has done well.  Denies tachycardia palpitations or tremor.  Patient is nonfasting today.  She has been taking her chlorthalidone and potassium.  No past medical history on file.  No past surgical history on file.  No family history on file.  Social History   Socioeconomic History   Marital status: Single    Spouse name: Not on file   Number of children: Not on file   Years of education: Not on file   Highest education level: Not on file  Occupational History   Not on file  Tobacco Use   Smoking status: Never   Smokeless tobacco: Never  Substance and Sexual Activity   Alcohol use: Yes    Comment: 2 glasses of wine per day   Drug use: Not Currently   Sexual activity: Not on file  Other Topics Concern   Not on file  Social History Narrative   Not on file   Social Determinants of Health   Financial Resource Strain: Not on file  Food Insecurity: Not on file  Transportation Needs: Not on file  Physical Activity: Not on file  Stress: Not on file  Social Connections: Not on file  Intimate Partner Violence: Not on file    Outpatient Medications Prior to Visit  Medication Sig Dispense Refill   chlorthalidone (HYGROTON) 25 MG tablet Take 1 tablet (25 mg total) by mouth daily. 90 tablet 3   clobetasol ointment (TEMOVATE) 0.05 % Apply 60 application topically 2 (two) times daily. 30 g 1   fluticasone (FLONASE) 50 MCG/ACT nasal spray Place 2 sprays into both nostrils daily. 16 g 6   potassium chloride SA (KLOR-CON) 20 MEQ tablet TAKE 1 TABLET BY MOUTH ONCE DAILY 90 tablet 3   Blood  Pressure Monitoring (BLOOD PRESSURE CUFF) MISC As directed. (Patient not taking: Reported on 09/17/2020) 1 each 0   No facility-administered medications prior to visit.    No Known Allergies  ROS Review of Systems  Constitutional:  Negative for chills, diaphoresis, fatigue, fever and unexpected weight change.  HENT: Negative.    Eyes:  Negative for photophobia and visual disturbance.  Respiratory:  Negative for chest tightness and shortness of breath.   Cardiovascular:  Negative for palpitations.  Gastrointestinal: Negative.   Genitourinary: Negative.   Musculoskeletal:  Negative for gait problem and joint swelling.  Neurological:  Negative for tremors, speech difficulty and weakness.  Psychiatric/Behavioral: Negative.       Objective:    Physical Exam Vitals and nursing note reviewed.  Constitutional:      General: She is not in acute distress.    Appearance: Normal appearance. She is not ill-appearing, toxic-appearing or diaphoretic.  HENT:     Head: Normocephalic and atraumatic.     Right Ear: Tympanic membrane, ear canal and external ear normal.     Left Ear: Tympanic membrane, ear canal and external ear normal.     Mouth/Throat:     Mouth: Mucous membranes are moist.     Pharynx: Oropharynx is clear. No oropharyngeal exudate or posterior oropharyngeal erythema.  Eyes:  General: No scleral icterus.       Right eye: No discharge.        Left eye: No discharge.     Extraocular Movements: Extraocular movements intact.     Conjunctiva/sclera: Conjunctivae normal.     Pupils: Pupils are equal, round, and reactive to light.   Neck:     Vascular: No carotid bruit.  Cardiovascular:     Rate and Rhythm: Normal rate and regular rhythm.  Pulmonary:     Effort: Pulmonary effort is normal.     Breath sounds: Normal breath sounds.  Abdominal:     General: Bowel sounds are normal.  Musculoskeletal:     Cervical back: No rigidity or tenderness.  Lymphadenopathy:      Cervical: No cervical adenopathy.  Skin:    General: Skin is warm and dry.  Neurological:     Mental Status: She is alert and oriented to person, place, and time.  Psychiatric:        Mood and Affect: Mood normal.        Behavior: Behavior normal.    BP 140/76   Pulse 80   Temp (!) 97.5 F (36.4 C) (Temporal)   Ht 5\' 11"  (1.803 m)   Wt 223 lb 3.2 oz (101.2 kg)   SpO2 98%   BMI 31.13 kg/m  Wt Readings from Last 3 Encounters:  09/17/20 223 lb 3.2 oz (101.2 kg)  06/10/20 220 lb 12.8 oz (100.2 kg)  05/08/19 216 lb 6.4 oz (98.2 kg)     Health Maintenance Due  Topic Date Due   COVID-19 Vaccine (1) Never done   TETANUS/TDAP  Never done   COLONOSCOPY (Pts 45-18yrs Insurance coverage will need to be confirmed)  Never done   Zoster Vaccines- Shingrix (1 of 2) Never done   PAP SMEAR-Modifier  04/14/2019    There are no preventive care reminders to display for this patient.  Lab Results  Component Value Date   TSH <0.01 Repeated and verified X2. (L) 05/08/2019   Lab Results  Component Value Date   WBC 4.6 02/06/2019   HGB 14.2 02/06/2019   HCT 42.1 02/06/2019   MCV 97.3 02/06/2019   PLT 219.0 02/06/2019   Lab Results  Component Value Date   NA 138 06/19/2020   K 3.7 06/19/2020   CO2 33 (H) 06/19/2020   GLUCOSE 110 (H) 06/19/2020   BUN 15 06/19/2020   CREATININE 0.85 06/19/2020   BILITOT 0.5 02/06/2019   ALKPHOS 72 02/06/2019   AST 22 02/06/2019   ALT 18 02/06/2019   PROT 7.4 02/06/2019   ALBUMIN 4.0 02/06/2019   CALCIUM 9.9 06/19/2020   GFR 73.38 06/19/2020   Lab Results  Component Value Date   CHOL 217 (H) 05/31/2017   Lab Results  Component Value Date   HDL 85.30 05/31/2017   Lab Results  Component Value Date   LDLCALC 114 (H) 05/31/2017   Lab Results  Component Value Date   TRIG 91.0 05/31/2017   Lab Results  Component Value Date   CHOLHDL 3 05/31/2017   No results found for: HGBA1C    Assessment & Plan:   Problem List Items Addressed  This Visit       Cardiovascular and Mediastinum   Essential hypertension - Primary   Relevant Medications   metoprolol succinate (TOPROL-XL) 25 MG 24 hr tablet   Blood Pressure Monitoring (COMFORT TOUCH BP CUFF/MEDIUM) MISC   Other Relevant Orders   Basic metabolic panel   CBC  Urinalysis, Routine w reflex microscopic     Endocrine   Hyperthyroidism   Relevant Medications   metoprolol succinate (TOPROL-XL) 25 MG 24 hr tablet   Other Relevant Orders   TSH   Thyroid stimulating immunoglobulin   T3, free   Ambulatory referral to Endocrinology   Graves disease   Relevant Medications   metoprolol succinate (TOPROL-XL) 25 MG 24 hr tablet   Other Relevant Orders   TSH   Thyroid stimulating immunoglobulin   T3, free   Ambulatory referral to Endocrinology    Meds ordered this encounter  Medications   metoprolol succinate (TOPROL-XL) 25 MG 24 hr tablet    Sig: Take 1 tablet (25 mg total) by mouth daily.    Dispense:  90 tablet    Refill:  1   Blood Pressure Monitoring (COMFORT TOUCH BP CUFF/MEDIUM) MISC    Sig: 1 Units by Does not apply route as directed.    Dispense:  1 each    Refill:  CUFF     Follow-up: Return in about 3 months (around 12/18/2020).  Have added low-dose metoprolol succinate extended release for better control of blood pressure.  Advised patient to return fasting in 3 months and we can check her cholesterol.  Patient had a Cologuard this past year that was normal.  Mliss Sax, MD

## 2020-09-19 NOTE — Progress Notes (Signed)
Patient needs to see endocrinologist as recommended.

## 2020-09-22 LAB — EXTRA SPECIMEN

## 2020-09-22 LAB — THYROID STIMULATING IMMUNOGLOBULIN

## 2020-09-26 ENCOUNTER — Other Ambulatory Visit (INDEPENDENT_AMBULATORY_CARE_PROVIDER_SITE_OTHER): Payer: BC Managed Care – PPO

## 2020-09-26 ENCOUNTER — Other Ambulatory Visit: Payer: Self-pay

## 2020-09-26 DIAGNOSIS — E0590 Thyrotoxicosis, unspecified without thyrotoxic crisis or storm: Secondary | ICD-10-CM | POA: Diagnosis not present

## 2020-09-26 NOTE — Progress Notes (Signed)
Per the orders of dr.Kremer pt is here for labs, pt tolerated draw well.

## 2020-10-01 LAB — THYROID STIMULATING IMMUNOGLOBULIN

## 2020-10-01 LAB — EXTRA SPECIMEN

## 2020-10-28 ENCOUNTER — Telehealth: Payer: Self-pay | Admitting: Family Medicine

## 2020-10-28 NOTE — Telephone Encounter (Signed)
Done

## 2020-10-28 NOTE — Telephone Encounter (Signed)
[facility] is wanting pt's June and July labs faxed over to [phone].

## 2020-11-11 ENCOUNTER — Other Ambulatory Visit: Payer: Self-pay | Admitting: Family Medicine

## 2020-11-11 DIAGNOSIS — L280 Lichen simplex chronicus: Secondary | ICD-10-CM

## 2020-12-18 ENCOUNTER — Ambulatory Visit: Payer: BC Managed Care – PPO | Admitting: Family Medicine

## 2021-04-29 ENCOUNTER — Other Ambulatory Visit: Payer: Self-pay | Admitting: Family Medicine

## 2021-04-29 DIAGNOSIS — L280 Lichen simplex chronicus: Secondary | ICD-10-CM

## 2021-04-29 NOTE — Telephone Encounter (Signed)
Chart supports rx refill Last ov: 08/28/2020 Last refill: 11/11/2020

## 2021-06-30 ENCOUNTER — Other Ambulatory Visit: Payer: Self-pay | Admitting: Family Medicine

## 2021-06-30 DIAGNOSIS — E0590 Thyrotoxicosis, unspecified without thyrotoxic crisis or storm: Secondary | ICD-10-CM

## 2021-06-30 DIAGNOSIS — I10 Essential (primary) hypertension: Secondary | ICD-10-CM

## 2021-07-06 ENCOUNTER — Other Ambulatory Visit: Payer: Self-pay | Admitting: Family Medicine

## 2021-07-06 DIAGNOSIS — L280 Lichen simplex chronicus: Secondary | ICD-10-CM

## 2021-07-23 ENCOUNTER — Telehealth: Payer: Self-pay | Admitting: Family Medicine

## 2021-07-23 NOTE — Telephone Encounter (Signed)
Pt would like to get an appt for a mommogram. Does she need a referral? ?

## 2021-07-24 ENCOUNTER — Other Ambulatory Visit: Payer: Self-pay

## 2021-07-24 DIAGNOSIS — Z1231 Encounter for screening mammogram for malignant neoplasm of breast: Secondary | ICD-10-CM

## 2021-07-24 NOTE — Telephone Encounter (Signed)
Order placed

## 2021-08-02 ENCOUNTER — Other Ambulatory Visit: Payer: Self-pay | Admitting: Family Medicine

## 2021-08-02 DIAGNOSIS — I10 Essential (primary) hypertension: Secondary | ICD-10-CM

## 2021-08-04 LAB — HM MAMMOGRAPHY

## 2021-09-21 ENCOUNTER — Other Ambulatory Visit: Payer: Self-pay | Admitting: Family Medicine

## 2021-09-21 DIAGNOSIS — I10 Essential (primary) hypertension: Secondary | ICD-10-CM

## 2021-09-21 DIAGNOSIS — E0590 Thyrotoxicosis, unspecified without thyrotoxic crisis or storm: Secondary | ICD-10-CM

## 2021-09-21 NOTE — Telephone Encounter (Signed)
Appt scheduled

## 2021-09-22 ENCOUNTER — Encounter: Payer: Self-pay | Admitting: Family Medicine

## 2021-10-12 ENCOUNTER — Ambulatory Visit: Payer: BC Managed Care – PPO | Admitting: Family Medicine

## 2021-10-26 ENCOUNTER — Ambulatory Visit: Payer: BC Managed Care – PPO | Admitting: Family Medicine

## 2021-11-12 ENCOUNTER — Telehealth: Payer: Self-pay | Admitting: Family Medicine

## 2021-11-12 ENCOUNTER — Ambulatory Visit: Payer: BC Managed Care – PPO | Admitting: Family Medicine

## 2021-11-12 NOTE — Telephone Encounter (Signed)
1st no show, fee waived, letter sent 

## 2021-11-12 NOTE — Telephone Encounter (Signed)
8.17.23 no show letter sent 

## 2022-01-12 ENCOUNTER — Ambulatory Visit (INDEPENDENT_AMBULATORY_CARE_PROVIDER_SITE_OTHER): Payer: BC Managed Care – PPO | Admitting: Family Medicine

## 2022-01-12 ENCOUNTER — Encounter: Payer: Self-pay | Admitting: Family Medicine

## 2022-01-12 VITALS — BP 172/102 | HR 73 | Temp 97.4°F | Ht 71.0 in | Wt 224.2 lb

## 2022-01-12 DIAGNOSIS — E0500 Thyrotoxicosis with diffuse goiter without thyrotoxic crisis or storm: Secondary | ICD-10-CM | POA: Diagnosis not present

## 2022-01-12 DIAGNOSIS — Z91199 Patient's noncompliance with other medical treatment and regimen due to unspecified reason: Secondary | ICD-10-CM

## 2022-01-12 DIAGNOSIS — I10 Essential (primary) hypertension: Secondary | ICD-10-CM | POA: Diagnosis not present

## 2022-01-12 LAB — BASIC METABOLIC PANEL
BUN: 13 mg/dL (ref 6–23)
CO2: 29 mEq/L (ref 19–32)
Calcium: 9.5 mg/dL (ref 8.4–10.5)
Chloride: 105 mEq/L (ref 96–112)
Creatinine, Ser: 0.77 mg/dL (ref 0.40–1.20)
GFR: 81.71 mL/min (ref >60.00)
Glucose, Bld: 98 mg/dL (ref 70–99)
Potassium: 3.8 mEq/L (ref 3.5–5.1)
Sodium: 139 mEq/L (ref 135–145)

## 2022-01-12 MED ORDER — CHLORTHALIDONE 25 MG PO TABS: 25.0000 mg | ORAL_TABLET | Freq: Every day | ORAL | 1 refills | Status: DC

## 2022-01-12 MED ORDER — CARVEDILOL 12.5 MG PO TABS: 12.5000 mg | ORAL_TABLET | Freq: Two times a day (BID) | ORAL | 1 refills | Status: DC

## 2022-01-12 MED ORDER — POTASSIUM CHLORIDE CRYS ER 20 MEQ PO TBCR: 20.0000 meq | EXTENDED_RELEASE_TABLET | Freq: Every day | ORAL | 3 refills | Status: DC

## 2022-01-12 NOTE — Progress Notes (Signed)
Established Patient Office Visit  Subjective   Patient ID: Tammy Booker, female    DOB: Oct 21, 1955  Age: 66 y.o. MRN: [id number]  Chief Complaint  Patient presents with   Follow-up    Routine follow up, no concerns. Patient not fasting.     HPI for follow-up of hypertension and Graves' disease.  She has been out of medicines.  She was under the impression that she was going to be taking her potassium for blood pressure control.  She was referred to endocrinology last visit 3 months ago for follow-up of Graves' disease and has not been seen yet.    Review of Systems  Constitutional: Negative.   HENT: Negative.    Eyes:  Negative for blurred vision, discharge and redness.  Respiratory: Negative.    Cardiovascular: Negative.   Gastrointestinal:  Negative for abdominal pain.  Genitourinary: Negative.   Musculoskeletal: Negative.  Negative for myalgias.  Skin:  Negative for rash.  Neurological:  Negative for tingling, loss of consciousness and weakness.  Endo/Heme/Allergies:  Negative for polydipsia.      Objective:     BP (!) 172/102 (BP Location: Right Arm, Patient Position: Sitting, Cuff Size: Large)   Pulse 73   Temp (!) 97.4 F (36.3 C) (Temporal)   Ht 5\' 11"  (1.803 m)   Wt 224 lb 3.2 oz (101.7 kg)   SpO2 97%   BMI 31.27 kg/m  BP Readings from Last 3 Encounters:  01/12/22 (!) 172/102  09/17/20 140/76  06/10/20 (!) 156/90   Wt Readings from Last 3 Encounters:  01/12/22 224 lb 3.2 oz (101.7 kg)  09/17/20 223 lb 3.2 oz (101.2 kg)  06/10/20 220 lb 12.8 oz (100.2 kg)      Physical Exam Constitutional:      General: She is not in acute distress.    Appearance: Normal appearance. She is not ill-appearing, toxic-appearing or diaphoretic.  HENT:     Head: Normocephalic and atraumatic.     Right Ear: External ear normal.     Left Ear: External ear normal.  Eyes:     General: No scleral icterus.       Right eye: No discharge.        Left eye: No discharge.      Extraocular Movements: Extraocular movements intact.     Conjunctiva/sclera: Conjunctivae normal.  Pulmonary:     Effort: Pulmonary effort is normal. No respiratory distress.  Skin:    General: Skin is warm and dry.  Neurological:     Mental Status: She is alert and oriented to person, place, and time.  Psychiatric:        Mood and Affect: Mood normal.        Behavior: Behavior normal.      No results found for any visits on 01/12/22.    The ASCVD Risk score (Arnett DK, et al., 2019) failed to calculate for the following reasons:   Cannot find a previous HDL lab   Cannot find a previous total cholesterol lab    Assessment & Plan:   Problem List Items Addressed This Visit       Cardiovascular and Mediastinum   Essential hypertension - Primary   Relevant Medications   potassium chloride SA (KLOR-CON M) 20 MEQ tablet   carvedilol (COREG) 12.5 MG tablet   chlorthalidone (HYGROTON) 25 MG tablet   Other Relevant Orders   Basic metabolic panel     Endocrine   Graves disease   Relevant Medications   carvedilol (  COREG) 12.5 MG tablet   Other Relevant Orders   Ambulatory referral to Endocrinology     Other   Non-compliant patient    Return in about 1 month (around 02/12/2022).  Restart chlorthalidone.  Discontinued metoprolol switched to carvedilol.  Continue potassium.  Given 30 pills of each with 1 refill. we referred her to endocrinology again.  Explained to her that untreated hyperthyroidism can affect her vision eventually by affecting the muscles that move her eyeballs.  She declines flu vaccine today.  Libby Maw, MD

## 2022-02-16 ENCOUNTER — Ambulatory Visit: Payer: BC Managed Care – PPO | Admitting: Family Medicine

## 2022-02-16 ENCOUNTER — Encounter: Payer: Self-pay | Admitting: Family Medicine

## 2022-02-16 VITALS — BP 142/80 | HR 57 | Temp 97.0°F | Ht 71.0 in | Wt 219.6 lb

## 2022-02-16 DIAGNOSIS — I10 Essential (primary) hypertension: Secondary | ICD-10-CM

## 2022-02-16 DIAGNOSIS — E0500 Thyrotoxicosis with diffuse goiter without thyrotoxic crisis or storm: Secondary | ICD-10-CM

## 2022-02-16 DIAGNOSIS — L280 Lichen simplex chronicus: Secondary | ICD-10-CM | POA: Diagnosis not present

## 2022-02-16 MED ORDER — CARVEDILOL 12.5 MG PO TABS: 12.5000 mg | ORAL_TABLET | Freq: Two times a day (BID) | ORAL | 1 refills | Status: DC

## 2022-02-16 MED ORDER — CLOBETASOL PROPIONATE 0.05 % EX OINT: TOPICAL_OINTMENT | CUTANEOUS | 0 refills | Status: DC

## 2022-02-16 MED ORDER — CHLORTHALIDONE 25 MG PO TABS: 25.0000 mg | ORAL_TABLET | Freq: Every day | ORAL | 1 refills | Status: DC

## 2022-02-16 MED ORDER — POTASSIUM CHLORIDE CRYS ER 20 MEQ PO TBCR: 20.0000 meq | EXTENDED_RELEASE_TABLET | Freq: Every day | ORAL | 3 refills | Status: DC

## 2022-02-16 NOTE — Progress Notes (Signed)
Established Patient Office Visit  Subjective   Patient ID: Tammy Booker, female    DOB: [birth year]  Age: 66 y.o. MRN: [id number]  Chief Complaint  Patient presents with   Follow-up    1 month follow up, no concerns. Patient not fasting.     HPI for follow-up of hypertension and lichen simplex chronicus.  Rashes on anterior upper chest and itches.  Clobetasol has helped a great deal.  She has restarted chlorthalidone 25 mg, Klor-Con 20 meqs carvedilol 12.5 twice daily.  She has been taking the carvedilol twice daily.  Medications are agreeing with her.  Pressure is certainly lower than last visit.  Not heard from endocrinology about her follow-up.    Review of Systems  Constitutional: Negative.   HENT: Negative.    Eyes:  Negative for blurred vision, discharge and redness.  Respiratory: Negative.    Cardiovascular: Negative.   Gastrointestinal:  Negative for abdominal pain.  Genitourinary: Negative.   Musculoskeletal: Negative.  Negative for myalgias.  Skin:  Positive for itching and rash.  Neurological:  Negative for tingling, loss of consciousness and weakness.  Endo/Heme/Allergies:  Negative for polydipsia.      Objective:     BP (!) 142/80 (BP Location: Right Arm, Patient Position: Sitting, Cuff Size: Large)   Pulse (!) 57   Temp (!) 97 F (36.1 C) (Temporal)   Ht 5\' 11"  (1.803 m)   Wt 219 lb 9.6 oz (99.6 kg)   SpO2 97%   BMI 30.63 kg/m    Physical Exam Constitutional:      General: She is not in acute distress.    Appearance: Normal appearance. She is not ill-appearing, toxic-appearing or diaphoretic.  HENT:     Head: Normocephalic and atraumatic.     Right Ear: External ear normal.     Left Ear: External ear normal.     Mouth/Throat:     Mouth: Mucous membranes are moist.     Pharynx: Oropharynx is clear. No oropharyngeal exudate or posterior oropharyngeal erythema.  Eyes:     General: No scleral icterus.       Right eye: No discharge.        Left eye:  No discharge.     Extraocular Movements: Extraocular movements intact.     Conjunctiva/sclera: Conjunctivae normal.  Pulmonary:     Effort: Pulmonary effort is normal. No respiratory distress.  Skin:    General: Skin is warm and dry.  Neurological:     Mental Status: She is alert and oriented to person, place, and time.  Psychiatric:        Mood and Affect: Mood normal.        Behavior: Behavior normal.      No results found for any visits on 02/16/22.    The ASCVD Risk score (Arnett DK, et al., 2019) failed to calculate for the following reasons:   Cannot find a previous HDL lab   Cannot find a previous total cholesterol lab    Assessment & Plan:   Problem List Items Addressed This Visit       Cardiovascular and Mediastinum   Essential hypertension - Primary   Relevant Medications   potassium chloride SA (KLOR-CON M) 20 MEQ tablet   chlorthalidone (HYGROTON) 25 MG tablet   carvedilol (COREG) 12.5 MG tablet     Endocrine   Graves disease   Relevant Medications   carvedilol (COREG) 12.5 MG tablet   Other Relevant Orders   Ambulatory referral to Endocrinology  Musculoskeletal and Integument   Lichen simplex chronicus   Relevant Medications   clobetasol ointment (TEMOVATE) 0.05 %    Return in about 8 weeks (around 04/13/2022).  Continue current medications.  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  Continue potassium chlorthalidone 25 and carvedilol 12.5 twice daily.  Pressure cuff and check and record blood pressures.  Could consider amlodipine.  Planning on having a flu shot after Thanksgiving.  Referral to endocrinology for follow-up of Graves' disease.  Has not been contacted from last months referral request.  Mliss Sax, MD

## 2022-06-12 ENCOUNTER — Other Ambulatory Visit: Payer: Self-pay | Admitting: Family Medicine

## 2022-06-12 DIAGNOSIS — L280 Lichen simplex chronicus: Secondary | ICD-10-CM

## 2022-09-02 ENCOUNTER — Other Ambulatory Visit: Payer: Self-pay | Admitting: Family Medicine

## 2022-09-02 DIAGNOSIS — L280 Lichen simplex chronicus: Secondary | ICD-10-CM

## 2022-09-03 ENCOUNTER — Telehealth: Payer: Self-pay | Admitting: Family Medicine

## 2022-09-03 NOTE — Telephone Encounter (Signed)
Pt is in need of a refill for clobetasol ointment (TEMOVATE) 0.05 % [[id number]].  [facility] [address]

## 2022-09-03 NOTE — Telephone Encounter (Signed)
done

## 2022-09-03 NOTE — Telephone Encounter (Signed)
Patient needs an appointment before refill can be sent per notes on script

## 2022-09-06 ENCOUNTER — Ambulatory Visit: Payer: BC Managed Care – PPO | Admitting: Family Medicine

## 2022-09-06 ENCOUNTER — Telehealth: Payer: Self-pay | Admitting: Family Medicine

## 2022-09-06 NOTE — Telephone Encounter (Signed)
Pt NS on 6/10 no reason available letter sent.

## 2022-09-07 NOTE — Telephone Encounter (Signed)
2nd no show, fee generated, final warning sent via mail and mychart, sent text  

## 2022-09-08 ENCOUNTER — Encounter: Payer: Self-pay | Admitting: Family Medicine

## 2022-09-08 ENCOUNTER — Ambulatory Visit: Payer: BC Managed Care – PPO | Admitting: Family Medicine

## 2022-09-08 VITALS — BP 136/70 | HR 66 | Temp 98.6°F | Ht 71.0 in | Wt 224.4 lb

## 2022-09-08 DIAGNOSIS — Z91199 Patient's noncompliance with other medical treatment and regimen due to unspecified reason: Secondary | ICD-10-CM

## 2022-09-08 DIAGNOSIS — L280 Lichen simplex chronicus: Secondary | ICD-10-CM

## 2022-09-08 DIAGNOSIS — E0500 Thyrotoxicosis with diffuse goiter without thyrotoxic crisis or storm: Secondary | ICD-10-CM | POA: Diagnosis not present

## 2022-09-08 DIAGNOSIS — I10 Essential (primary) hypertension: Secondary | ICD-10-CM

## 2022-09-08 LAB — BASIC METABOLIC PANEL
BUN: 11 mg/dL (ref 6–23)
CO2: 30 mEq/L (ref 19–32)
Calcium: 9.3 mg/dL (ref 8.4–10.5)
Chloride: 105 mEq/L (ref 96–112)
Creatinine, Ser: 0.84 mg/dL (ref 0.40–1.20)
GFR: 73.27 mL/min (ref >60.00)
Glucose, Bld: 95 mg/dL (ref 70–99)
Potassium: 4.1 mEq/L (ref 3.5–5.1)
Sodium: 143 mEq/L (ref 135–145)

## 2022-09-08 LAB — T3, FREE: T3, Free: 2.8 pg/mL (ref 2.3–4.2)

## 2022-09-08 LAB — TSH: TSH: 0.96 u[IU]/mL (ref 0.35–5.50)

## 2022-09-08 MED ORDER — CLOBETASOL PROPIONATE 0.05 % EX OINT
TOPICAL_OINTMENT | CUTANEOUS | 0 refills | Status: DC
Start: 2022-09-08 — End: 2023-01-03

## 2022-09-08 MED ORDER — CLOBETASOL PROPIONATE 0.05 % EX OINT: TOPICAL_OINTMENT | CUTANEOUS | 0 refills | Status: DC

## 2022-09-08 NOTE — Progress Notes (Signed)
Established Patient Office Visit   Subjective:  Patient ID: Tammy Booker, female    DOB: 1955/09/03  Age: 67 y.o. MRN: [id number]  Chief Complaint  Patient presents with   Medical Management of Chronic Issues    Routine follow up on medications. No concerns.     HPI Encounter Diagnoses  Name Primary?   Essential hypertension Yes   Lichen simplex chronicus    Non-compliant patient    Graves disease    For follow-up of the above.  Appointment with endocrinology is scheduled for August.  Continues chlorthalidone with potassium and carvedilol for hypertension.  Continues Temovate for lichen simplex chronicus.  Dermatology concurs.  Ophthalmology is following for chalazion.  Continues to drive schoolbus.   Review of Systems  Constitutional: Negative.   HENT: Negative.    Eyes:  Negative for blurred vision, discharge and redness.  Respiratory: Negative.    Cardiovascular: Negative.   Gastrointestinal:  Negative for abdominal pain.  Genitourinary: Negative.   Musculoskeletal: Negative.  Negative for myalgias.  Skin:  Negative for rash.  Neurological:  Negative for tingling, loss of consciousness and weakness.  Endo/Heme/Allergies:  Negative for polydipsia.      09/08/2022   11:11 AM 02/16/2022    9:30 AM 01/12/2022    9:37 AM  Depression screen PHQ 2/9  Decreased Interest 0 0 0  Down, Depressed, Hopeless 0 0 0  PHQ - 2 Score 0 0 0       Current Outpatient Medications:    carvedilol (COREG) 12.5 MG tablet, Take 1 tablet (12.5 mg total) by mouth 2 (two) times daily with a meal., Disp: 60 tablet, Rfl: 1   chlorthalidone (HYGROTON) 25 MG tablet, Take 1 tablet (25 mg total) by mouth daily., Disp: 30 tablet, Rfl: 1   fluticasone (FLONASE) 50 MCG/ACT nasal spray, Place 2 sprays into both nostrils daily., Disp: 16 g, Rfl: 6   potassium chloride SA (KLOR-CON M) 20 MEQ tablet, Take 1 tablet (20 mEq total) by mouth daily., Disp: 30 tablet, Rfl: 3   Blood Pressure Monitoring  (COMFORT TOUCH BP CUFF/MEDIUM) MISC, 1 Units by Does not apply route as directed. (Patient not taking: Reported on 02/16/2022), Disp: 1 each, Rfl: CUFF   clobetasol ointment (TEMOVATE) 0.05 %, May apply twice daily to affected areas.  Not for face or private areas., Disp: 30 g, Rfl: 0   Objective:     BP 136/70 (BP Location: Right Arm, Patient Position: Sitting, Cuff Size: Large)   Pulse 66   Temp 98.6 F (37 C) (Temporal)   Ht 5\' 11"  (1.803 m)   Wt 224 lb 6.4 oz (101.8 kg)   SpO2 97%   BMI 31.30 kg/m    Physical Exam Constitutional:      General: She is not in acute distress.    Appearance: Normal appearance. She is not ill-appearing, toxic-appearing or diaphoretic.  HENT:     Head: Normocephalic and atraumatic.     Right Ear: External ear normal.     Left Ear: External ear normal.     Mouth/Throat:     Mouth: Mucous membranes are moist.     Pharynx: Oropharynx is clear. No oropharyngeal exudate or posterior oropharyngeal erythema.  Eyes:     General: No scleral icterus.       Right eye: No discharge.        Left eye: No discharge.     Extraocular Movements: Extraocular movements intact.     Conjunctiva/sclera: Conjunctivae normal.  Pupils: Pupils are equal, round, and reactive to light.   Cardiovascular:     Rate and Rhythm: Normal rate and regular rhythm.  Pulmonary:     Effort: Pulmonary effort is normal. No respiratory distress.     Breath sounds: Normal breath sounds.  Abdominal:     General: Bowel sounds are normal.     Tenderness: There is no abdominal tenderness. There is no guarding.  Musculoskeletal:     Cervical back: No rigidity or tenderness.  Skin:    General: Skin is warm and dry.  Neurological:     Mental Status: She is alert and oriented to person, place, and time.  Psychiatric:        Mood and Affect: Mood normal.        Behavior: Behavior normal.      No results found for any visits on 09/08/22.    The ASCVD Risk score (Arnett DK, et  al., 2019) failed to calculate for the following reasons:   Cannot find a previous HDL lab   Cannot find a previous total cholesterol lab    Assessment & Plan:   Essential hypertension -     Basic metabolic panel  Lichen simplex chronicus -     Clobetasol Propionate; May apply twice daily to affected areas.  Not for face or private areas.  Dispense: 30 g; Refill: 0  Non-compliant patient  Graves disease -     TSH -     T3, free -     Thyroid stimulating immunoglobulin    Return in about 3 months (around 12/09/2022), or if symptoms worsen or fail to improve.  Continue current medications.  Continue follow-up with ophthalmology.  Mliss Sax, MD

## 2022-09-10 ENCOUNTER — Telehealth: Payer: Self-pay | Admitting: Family Medicine

## 2022-09-10 DIAGNOSIS — I10 Essential (primary) hypertension: Secondary | ICD-10-CM

## 2022-09-10 LAB — THYROID STIMULATING IMMUNOGLOBULIN: TSI: 98 % baseline (ref <140)

## 2022-09-10 MED ORDER — CARVEDILOL 12.5 MG PO TABS
12.5000 mg | ORAL_TABLET | Freq: Two times a day (BID) | ORAL | 1 refills | Status: DC
Start: 2022-09-10 — End: 2023-04-28

## 2022-09-10 MED ORDER — POTASSIUM CHLORIDE CRYS ER 20 MEQ PO TBCR
20.0000 meq | EXTENDED_RELEASE_TABLET | Freq: Every day | ORAL | 3 refills | Status: DC
Start: 2022-09-10 — End: 2023-04-27

## 2022-09-10 MED ORDER — CHLORTHALIDONE 25 MG PO TABS
25.0000 mg | ORAL_TABLET | Freq: Every day | ORAL | 1 refills | Status: DC
Start: 2022-09-10 — End: 2023-04-28

## 2022-09-10 NOTE — Telephone Encounter (Signed)
Prescription Request  09/10/2022  LOV: 09/08/2022  What is the name of the medication or equipment? carvedilol (COREG) 12.5 MG tablet [[id number]] , chlorthalidone (HYGROTON) 25 MG tablet [[id number]] , potassium chloride SA (KLOR-CON M) 20 MEQ tablet [[id number]]    Have you contacted your pharmacy to request a refill? No   Which pharmacy would you like this sent to?  [id number] - [place], [place] - [address] [place] [address] Phone: [phone] Fax: [phone]     Patient notified that their request is being sent to the clinical staff for review and that they should receive a response within 2 business days.   Please advise at Mobile [phone] (mobile)

## 2022-11-03 ENCOUNTER — Encounter: Payer: Self-pay | Admitting: Internal Medicine

## 2022-11-03 ENCOUNTER — Ambulatory Visit: Payer: BC Managed Care – PPO | Admitting: Internal Medicine

## 2022-11-03 VITALS — BP 118/74 | HR 56 | Ht 71.0 in | Wt 218.6 lb

## 2022-11-03 DIAGNOSIS — E0500 Thyrotoxicosis with diffuse goiter without thyrotoxic crisis or storm: Secondary | ICD-10-CM

## 2022-11-03 LAB — T4, FREE: Free T4: 0.65 ng/dL (ref 0.60–1.60)

## 2022-11-03 LAB — TSH: TSH: 0.8 u[IU]/mL (ref 0.35–5.50)

## 2022-11-03 NOTE — Progress Notes (Signed)
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 female with a past medical history of HTN and Graves' disease. The patient presented for initial endocrinology clinic visit on 11/03/2022 for consultative assistance with her [name].    She has been diagnosed with hyperthyroidism in her 30s, she required thionamides therapy for approximately a year.   Patient has been noted with suppressed TSH <0.01 uIU/mL in 2021 with normal T3 and T4.  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 Past Surgical History: No past surgical history on file.  Social History:  reports that she has never smoked. She has never used smokeless tobacco. She reports current alcohol use. She reports that she does not currently use drugs. Family History: family history is not on file.   HOME MEDICATIONS: Allergies as of 11/03/2022   No Known Allergies      Medication List        Accurate as of November 03, 2022  9:29 AM. If you have any questions, ask your nurse or doctor.          carvedilol 12.5 MG tablet Commonly known as: COREG Take 1 tablet (12.5 mg total) by mouth 2 (two) times daily with a meal.   chlorthalidone 25 MG tablet Commonly known as: HYGROTON Take 1 tablet (25 mg total) by mouth daily.   clobetasol ointment 0.05 % Commonly known as: TEMOVATE May apply twice daily to affected areas.  Not for face or private areas.   Comfort Touch BP Cuff/Medium  Misc 1 Units by Does not apply route as directed.   fluticasone 50 MCG/ACT nasal spray Commonly known as: FLONASE Place 2 sprays into both nostrils daily.   potassium chloride SA 20 MEQ tablet Commonly known as: KLOR-CON M Take 1 tablet (20 mEq total) by mouth daily.          REVIEW OF SYSTEMS: A comprehensive ROS was conducted with the patient and is negative except as per HPI     OBJECTIVE:  VS: BP 118/74 (BP Location: Left Arm, Patient Position: Sitting, Cuff Size: Large)   Pulse (!) 56   Ht 5\' 11"  (1.803 m)   Wt 218 lb 9.6 oz (99.2 kg)   SpO2 98%   BMI 30.49 kg/m    Wt Readings from Last 3 Encounters:  11/03/22 218 lb 9.6 oz (99.2 kg)  09/08/22 224 lb 6.4 oz (101.8 kg)  02/16/22 219 lb 9.6 oz (99.6 kg)     EXAM: General: Pt appears well and is in NAD  Eyes: External eye exam normal without stare, lid lag or exophthalmos.  EOM intact.    Neck: General: Supple without adenopathy. Thyroid: Thyroid size normal.  No goiter or nodules appreciated. No thyroid bruit.  Lungs: Clear with good BS bilat   Heart: Auscultation: RRR.  Abdomen: Soft, nontender  Extremities:  BL LE: No pretibial edema  Mental Status: Judgment, insight: Intact Orientation: Oriented to time, place, and person Mood and affect: No depression, anxiety, or agitation     DATA REVIEWED:  Latest Reference Range & Units 11/03/22 09:47  TSH 0.35 - 5.50 uIU/mL 0.80  T4,Free(Direct) 0.60 - 1.60 ng/dL 1.61      Latest Reference Range & Units 05/31/17 09:42  TSI <140 % baseline 219 (H)    ASSESSMENT/PLAN/RECOMMENDATIONS:   Graves' disease:  -Patient is clinically euthyroid -Her TFTs are normal which confirms remission of Graves' disease -Discussed pathophysiology of Graves' disease, hyperthyroidism and its effects on cardiovascular system as well as bone health -Will continue to monitor  Medications : N/A  Follow-up in 6 months   Signed electronically by: Lyndle Herrlich,  MD  [facility] Endocrinology  [facility] [address] [place], [place] [address] Phone: [phone] FAX: [phone]   CC: Mliss Sax, MD [address] [place] [address] Phone: [phone] Fax: [phone]   Return to Endocrinology clinic as below: Future Appointments  Date Time Provider Department Center  11/03/2022  9:30 AM Ikram Riebe, Konrad Dolores, MD [facility] None

## 2023-01-01 ENCOUNTER — Other Ambulatory Visit: Payer: Self-pay | Admitting: Family Medicine

## 2023-01-01 DIAGNOSIS — L280 Lichen simplex chronicus: Secondary | ICD-10-CM

## 2023-04-19 ENCOUNTER — Other Ambulatory Visit: Payer: Self-pay | Admitting: Family Medicine

## 2023-04-19 DIAGNOSIS — I10 Essential (primary) hypertension: Secondary | ICD-10-CM

## 2023-04-19 DIAGNOSIS — L280 Lichen simplex chronicus: Secondary | ICD-10-CM

## 2023-04-26 ENCOUNTER — Other Ambulatory Visit: Payer: Self-pay | Admitting: Family Medicine

## 2023-04-26 DIAGNOSIS — L280 Lichen simplex chronicus: Secondary | ICD-10-CM

## 2023-04-26 DIAGNOSIS — I10 Essential (primary) hypertension: Secondary | ICD-10-CM

## 2023-04-27 ENCOUNTER — Other Ambulatory Visit: Payer: Self-pay

## 2023-04-27 DIAGNOSIS — I10 Essential (primary) hypertension: Secondary | ICD-10-CM

## 2023-04-27 NOTE — Telephone Encounter (Signed)
[facility] Last Visit: 09-08-22 Next Visit: Visit date not found Last Refill: 09-10-22  Please Advise   PATIENT REQUEST 90 DAY SUPPLY REFILLS

## 2023-04-28 MED ORDER — POTASSIUM CHLORIDE CRYS ER 20 MEQ PO TBCR
20.0000 meq | EXTENDED_RELEASE_TABLET | Freq: Every day | ORAL | 1 refills | Status: DC
Start: 2023-04-28 — End: 2023-06-16

## 2023-04-28 NOTE — Telephone Encounter (Signed)
LOV  09/08/22 FOV   none scheduled.     Please review and advise.  Thanks. Dm/cma

## 2023-05-06 ENCOUNTER — Ambulatory Visit: Payer: BC Managed Care – PPO | Admitting: Internal Medicine

## 2023-05-30 ENCOUNTER — Ambulatory Visit: Payer: BC Managed Care – PPO | Admitting: Internal Medicine

## 2023-05-30 NOTE — Progress Notes (Deleted)
 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 female with a past medical history of HTN and Graves' disease. The patient presented for initial endocrinology clinic visit on 11/03/2022 for consultative assistance with her [name].    She has been diagnosed with hyperthyroidism in her 30s, she required thionamides therapy for approximately a year.   Patient has been noted with suppressed TSH <0.01 uIU/mL in 2021 with normal T3 and T4.  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  No local neck swelling  Denies palpitations  Denies diarrhea  Denies tremors  She does have some fatigue  Denies eye symptoms   HISTORY:  Past Medical History: No past medical history on file. Past Surgical History: No past surgical history on file.  Social History:  reports that she has never smoked. She has never used smokeless tobacco. She reports current alcohol use. She reports that she does not currently use drugs. Family History: family history is not on file.   HOME MEDICATIONS: Allergies as of 05/30/2023   No Known Allergies      Medication List        Accurate as of May 30, 2023  6:49 AM. If you have any questions, ask your nurse or doctor.          carvedilol 12.5 MG tablet Commonly known as: COREG TAKE 1 TABLET BY MOUTH TWICE DAILY WITH A MEAL   chlorthalidone 25 MG tablet Commonly known as: HYGROTON Take 1 tablet by mouth once daily    clobetasol ointment 0.05 % Commonly known as: TEMOVATE MAY APPLY TOPICALLY TO THE AFFECTED AREA(S) TWO TIMES PER DAY *NOT TO THE FACE OR PRIVATE AREA(S)*   Comfort Touch BP Cuff/Medium Misc 1 Units by Does not apply route as directed.   fluticasone 50 MCG/ACT nasal spray Commonly known as: FLONASE Place 2 sprays into both nostrils daily.   potassium chloride SA 20 MEQ tablet Commonly known as: KLOR-CON M Take 1 tablet (20 mEq total) by mouth daily.          REVIEW OF SYSTEMS: A comprehensive ROS was conducted with the patient and is negative except as per HPI     OBJECTIVE:  VS: There were no vitals taken for this visit.   Wt Readings from Last 3 Encounters:  11/03/22 218 lb 9.6 oz (99.2 kg)  09/08/22 224 lb 6.4 oz (101.8 kg)  02/16/22 219 lb 9.6 oz (99.6 kg)     EXAM: General: Pt appears well and is in NAD  Eyes: External eye exam normal without stare, lid lag or exophthalmos.  EOM intact.    Neck: General: Supple without adenopathy. Thyroid: Thyroid size normal.  No goiter or nodules appreciated. No thyroid bruit.  Lungs: Clear with good BS bilat   Heart: Auscultation: RRR.  Abdomen: Soft, nontender  Extremities:  BL LE: No pretibial edema   Mental  Status: Judgment, insight: Intact Orientation: Oriented to time, place, and person Mood and affect: No depression, anxiety, or agitation     DATA REVIEWED:  Latest Reference Range & Units 11/03/22 09:47  TSH 0.35 - 5.50 uIU/mL 0.80  T4,Free(Direct) 0.60 - 1.60 ng/dL 4.09      Latest Reference Range & Units 05/31/17 09:42  TSI <140 % baseline 219 (H)    ASSESSMENT/PLAN/RECOMMENDATIONS:   Graves' disease:  -Patient is clinically euthyroid -Her TFTs are normal which confirms remission of Graves' disease -Discussed pathophysiology of Graves' disease, hyperthyroidism and its effects on cardiovascular system as well as bone health -Will continue to monitor  Medications : N/A  Follow-up in 6  months   Signed electronically by: Lyndle Herrlich, MD  [facility] Endocrinology  [facility] [address] [place] [place], [place] [address] Phone: [phone] FAX: [phone]   CC: Mliss Sax, MD [address] [place] [address] Phone: [phone] Fax: [phone]   Return to Endocrinology clinic as below: Future Appointments  Date Time Provider Department Center  05/30/2023  8:10 AM Victorious Cosio, Konrad Dolores, MD [facility] None

## 2023-06-03 ENCOUNTER — Ambulatory Visit: Admitting: Internal Medicine

## 2023-06-03 ENCOUNTER — Encounter: Payer: Self-pay | Admitting: Internal Medicine

## 2023-06-03 VITALS — BP 110/76 | HR 78 | Ht 71.0 in | Wt 224.6 lb

## 2023-06-03 DIAGNOSIS — E0500 Thyrotoxicosis with diffuse goiter without thyrotoxic crisis or storm: Secondary | ICD-10-CM

## 2023-06-03 NOTE — Progress Notes (Signed)
 Name: Tammy Booker  MRN/ DOB: [id number], 08-01-1955    Age/ Sex: 68 y.o., female    PCP: Mliss Sax, MD   Reason for Endocrinology Evaluation: [name]     Date of Initial Endocrinology Evaluation: 11/03/2022    HPI: Ms. Tammy Booker is a 68 y.o. female with a past medical history of HTN and Graves' disease. The patient presented for initial endocrinology clinic visit on 11/03/2022 for consultative assistance with her [name].    She has been diagnosed with hyperthyroidism in her 30s, she required thionamides therapy for approximately a year.   Patient has been noted with suppressed TSH <0.01 uIU/mL in 2021 with normal T3 and T4.  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  Weight overall stable  No local neck swelling  Denies palpitations  Denies diarrhea or loose stools  Denies tremors  Denies eye symptoms , up to date on eye exam, has cataract  HISTORY:  Past Medical History: No past medical history on file. Past Surgical History: No past surgical history on file.  Social History:  reports that she has never smoked. She has never used smokeless tobacco. She reports current alcohol use. She reports that she does not currently use drugs. Family History: family history is not on file.   HOME MEDICATIONS: Allergies as of 06/03/2023   No Known Allergies      Medication List        Accurate as of June 03, 2023  2:28 PM. If you have any questions, ask your nurse or doctor.          carvedilol 12.5 MG tablet Commonly known as: COREG TAKE 1 TABLET BY MOUTH TWICE DAILY WITH A MEAL   chlorthalidone 25 MG tablet Commonly known as:  HYGROTON Take 1 tablet by mouth once daily   clobetasol ointment 0.05 % Commonly known as: TEMOVATE MAY APPLY TOPICALLY TO THE AFFECTED AREA(S) TWO TIMES PER DAY *NOT TO THE FACE OR PRIVATE AREA(S)*   Comfort Touch BP Cuff/Medium Misc 1 Units by Does not apply route as directed.   fluticasone 50 MCG/ACT nasal spray Commonly known as: FLONASE Place 2 sprays into both nostrils daily.   potassium chloride SA 20 MEQ tablet Commonly known as: KLOR-CON M Take 1 tablet (20 mEq total) by mouth daily.          REVIEW OF SYSTEMS: A comprehensive ROS was conducted with the patient and is negative except as per HPI     OBJECTIVE:  VS: BP 110/76 (BP Location: Left Arm, Patient Position: Sitting, Cuff Size: Normal)   Pulse 78   Ht 5\' 11"  (1.803 m)   Wt 224 lb 9.6 oz (101.9 kg)   SpO2 95%   BMI 31.33 kg/m    Wt Readings from Last 3 Encounters:  06/03/23 224 lb 9.6 oz (101.9 kg)  11/03/22 218 lb 9.6 oz (99.2 kg)  09/08/22 224 lb 6.4 oz (101.8 kg)     EXAM: General: Pt appears well and is in NAD  Eyes: External eye exam normal without stare, lid lag or exophthalmos.  EOM intact.    Neck: General:  Supple without adenopathy. Thyroid: Thyroid size normal.  No goiter or nodules appreciated. No thyroid bruit.  Lungs: Clear with good BS bilat   Heart: Auscultation: RRR.  Abdomen: Soft, nontender  Extremities:  BL LE: No pretibial edema   Mental Status: Judgment, insight: Intact Orientation: Oriented to time, place, and person Mood and affect: No depression, anxiety, or agitation     DATA REVIEWED:   Latest Reference Range & Units 06/03/23 14:39  TSH 0.40 - 4.50 mIU/L 0.87  Triiodothyronine,Free,Serum 2.3 - 4.2 pg/mL 3.3        Latest Reference Range & Units 05/31/17 09:42  TSI <140 % baseline 219 (H)    ASSESSMENT/PLAN/RECOMMENDATIONS:   Graves' disease:  -Patient is clinically euthyroid -Her TFTs are normal which confirms remission of Graves'  disease  Medications : N/A  Follow-up in 1 yr    Signed electronically by: Lyndle Herrlich, MD  [facility] Endocrinology  [facility] [address] [place], [place] [address] Phone: [phone] FAX: [phone]   CC: Mliss Sax, MD [address] [place] [address] Phone: [phone] Fax: [phone]   Return to Endocrinology clinic as below: Future Appointments  Date Time Provider Department Center  06/03/2023  2:40 PM Curby Carswell, Konrad Dolores, MD [id number] None

## 2023-06-04 LAB — T4, FREE: Free T4: 1.2 ng/dL (ref 0.8–1.8)

## 2023-06-04 LAB — TSH: TSH: 0.87 m[IU]/L (ref 0.40–4.50)

## 2023-06-04 LAB — T3, FREE: T3, Free: 3.3 pg/mL (ref 2.3–4.2)

## 2023-06-06 ENCOUNTER — Encounter: Payer: Self-pay | Admitting: Internal Medicine

## 2023-06-06 ENCOUNTER — Other Ambulatory Visit: Payer: Self-pay | Admitting: Family Medicine

## 2023-06-06 DIAGNOSIS — I10 Essential (primary) hypertension: Secondary | ICD-10-CM

## 2023-06-06 DIAGNOSIS — L280 Lichen simplex chronicus: Secondary | ICD-10-CM

## 2023-06-16 ENCOUNTER — Ambulatory Visit: Admitting: Family Medicine

## 2023-06-16 ENCOUNTER — Encounter: Payer: Self-pay | Admitting: Family Medicine

## 2023-06-16 VITALS — BP 162/82 | HR 72 | Temp 98.3°F | Ht 71.0 in | Wt 228.4 lb

## 2023-06-16 DIAGNOSIS — Z13220 Encounter for screening for lipoid disorders: Secondary | ICD-10-CM

## 2023-06-16 DIAGNOSIS — I10 Essential (primary) hypertension: Secondary | ICD-10-CM | POA: Diagnosis not present

## 2023-06-16 DIAGNOSIS — J3089 Other allergic rhinitis: Secondary | ICD-10-CM

## 2023-06-16 DIAGNOSIS — Z131 Encounter for screening for diabetes mellitus: Secondary | ICD-10-CM | POA: Diagnosis not present

## 2023-06-16 MED ORDER — CARVEDILOL 12.5 MG PO TABS
12.5000 mg | ORAL_TABLET | Freq: Two times a day (BID) | ORAL | 1 refills | Status: DC
Start: 2023-06-16 — End: 2023-08-11

## 2023-06-16 MED ORDER — POTASSIUM CHLORIDE CRYS ER 20 MEQ PO TBCR: 20.0000 meq | EXTENDED_RELEASE_TABLET | Freq: Every day | ORAL | 1 refills | Status: DC

## 2023-06-16 MED ORDER — CHLORTHALIDONE 25 MG PO TABS: 25.0000 mg | ORAL_TABLET | Freq: Every day | ORAL | 1 refills | Status: DC

## 2023-06-16 MED ORDER — FLUTICASONE PROPIONATE 50 MCG/ACT NA SUSP: 2.0000 | Freq: Every day | NASAL | 6 refills | Status: AC

## 2023-06-16 NOTE — Progress Notes (Signed)
 Established Patient Office Visit   Subjective:  Patient ID: Tammy Booker, female    DOB: 07-10-1955  Age: 68 y.o. MRN: [id number]  Chief Complaint  Patient presents with   Medical Management of Chronic Issues    Follow up. Pt is not fasting. Pt requesting Rx refills .     HPI Encounter Diagnoses  Name Primary?   Screening for diabetes mellitus Yes   Essential hypertension    Seasonal allergic rhinitis due to other allergic trigger    Screening for cholesterol level    For follow-up of hypertension and allergic rhinitis.  She has been out of her blood pressure medications.  Flonase has worked well for her allergy symptoms.  She would like to restart it.   Review of Systems  Constitutional: Negative.   HENT: Negative.    Eyes:  Negative for blurred vision, discharge and redness.  Respiratory: Negative.    Cardiovascular: Negative.   Gastrointestinal:  Negative for abdominal pain.  Genitourinary: Negative.   Musculoskeletal: Negative.  Negative for myalgias.  Skin:  Negative for rash.  Neurological:  Negative for tingling, loss of consciousness and weakness.  Endo/Heme/Allergies:  Negative for polydipsia.     Current Outpatient Medications:    Blood Pressure Monitoring (COMFORT TOUCH BP CUFF/MEDIUM) MISC, 1 Units by Does not apply route as directed., Disp: 1 each, Rfl: CUFF   clobetasol ointment (TEMOVATE) 0.05 %, MAY APPLY TOPICALLY TO THE AFFECTED AREA(S) TWO TIMES PER DAY *NOT TO THE FACE OR PRIVATE AREA(S)*, Disp: 30 g, Rfl: 0   carvedilol (COREG) 12.5 MG tablet, Take 1 tablet (12.5 mg total) by mouth 2 (two) times daily with a meal., Disp: 60 tablet, Rfl: 1   chlorthalidone (HYGROTON) 25 MG tablet, Take 1 tablet (25 mg total) by mouth daily., Disp: 30 tablet, Rfl: 1   fluticasone (FLONASE) 50 MCG/ACT nasal spray, Place 2 sprays into both nostrils daily., Disp: 16 g, Rfl: 6   potassium chloride SA (KLOR-CON M) 20 MEQ tablet, Take 1 tablet (20 mEq total) by mouth daily.,  Disp: 30 tablet, Rfl: 1   Objective:     BP (!) 162/82   Pulse 72   Temp 98.3 F (36.8 C)   Ht 5\' 11"  (1.803 m)   Wt 228 lb 6.4 oz (103.6 kg)   SpO2 98%   BMI 31.86 kg/m    Physical Exam Constitutional:      General: She is not in acute distress.    Appearance: Normal appearance. She is not ill-appearing, toxic-appearing or diaphoretic.  HENT:     Head: Normocephalic and atraumatic.     Right Ear: External ear normal.     Left Ear: External ear normal.     Mouth/Throat:     Mouth: Mucous membranes are moist.     Pharynx: Oropharynx is clear. No oropharyngeal exudate or posterior oropharyngeal erythema.  Eyes:     General: No scleral icterus.       Right eye: No discharge.        Left eye: No discharge.     Extraocular Movements: Extraocular movements intact.     Conjunctiva/sclera: Conjunctivae normal.     Pupils: Pupils are equal, round, and reactive to light.  Cardiovascular:     Rate and Rhythm: Normal rate and regular rhythm.  Pulmonary:     Effort: Pulmonary effort is normal. No respiratory distress.     Breath sounds: Normal breath sounds. No wheezing or rales.  Musculoskeletal:     Cervical back:  No rigidity or tenderness.  Lymphadenopathy:     Cervical: No cervical adenopathy.  Skin:    General: Skin is warm and dry.  Neurological:     Mental Status: She is alert and oriented to person, place, and time.  Psychiatric:        Mood and Affect: Mood normal.        Behavior: Behavior normal.      No results found for any visits on 06/16/23.    The ASCVD Risk score (Arnett DK, et al., 2019) failed to calculate for the following reasons:   Cannot find a previous HDL lab   Cannot find a previous total cholesterol lab    Assessment & Plan:   Screening for diabetes mellitus -     Comprehensive metabolic panel; Future -     Hemoglobin A1c; Future  Essential hypertension -     Potassium Chloride Crys ER; Take 1 tablet (20 mEq total) by mouth daily.   Dispense: 30 tablet; Refill: 1 -     Chlorthalidone; Take 1 tablet (25 mg total) by mouth daily.  Dispense: 30 tablet; Refill: 1 -     Carvedilol; Take 1 tablet (12.5 mg total) by mouth 2 (two) times daily with a meal.  Dispense: 60 tablet; Refill: 1 -     CBC; Future -     Comprehensive metabolic panel; Future -     Urinalysis, Routine w reflex microscopic; Future  Seasonal allergic rhinitis due to other allergic trigger -     Fluticasone Propionate; Place 2 sprays into both nostrils daily.  Dispense: 16 g; Refill: 6  Screening for cholesterol level -     Comprehensive metabolic panel; Future -     Lipid panel; Future    Return in about 6 weeks (around 07/28/2023), or Return fasting for blood work ordered today prior to your next appointment.Mliss Sax, MD

## 2023-07-07 DIAGNOSIS — Z6830 Body mass index (BMI) 30.0-30.9, adult: Secondary | ICD-10-CM | POA: Diagnosis not present

## 2023-07-07 DIAGNOSIS — J309 Allergic rhinitis, unspecified: Secondary | ICD-10-CM | POA: Diagnosis not present

## 2023-07-07 DIAGNOSIS — E669 Obesity, unspecified: Secondary | ICD-10-CM | POA: Diagnosis not present

## 2023-07-07 DIAGNOSIS — I10 Essential (primary) hypertension: Secondary | ICD-10-CM | POA: Diagnosis not present

## 2023-07-07 DIAGNOSIS — E876 Hypokalemia: Secondary | ICD-10-CM | POA: Diagnosis not present

## 2023-07-07 DIAGNOSIS — E049 Nontoxic goiter, unspecified: Secondary | ICD-10-CM | POA: Diagnosis not present

## 2023-07-23 ENCOUNTER — Other Ambulatory Visit: Payer: Self-pay | Admitting: Family Medicine

## 2023-07-23 DIAGNOSIS — L280 Lichen simplex chronicus: Secondary | ICD-10-CM

## 2023-08-08 ENCOUNTER — Ambulatory Visit: Admitting: Family Medicine

## 2023-08-11 ENCOUNTER — Encounter: Payer: Self-pay | Admitting: Family Medicine

## 2023-08-11 ENCOUNTER — Ambulatory Visit: Admitting: Family Medicine

## 2023-08-11 ENCOUNTER — Ambulatory Visit: Payer: Self-pay | Admitting: Family Medicine

## 2023-08-11 VITALS — BP 152/86 | HR 67 | Temp 97.9°F | Ht 71.0 in | Wt 231.0 lb

## 2023-08-11 DIAGNOSIS — Z13220 Encounter for screening for lipoid disorders: Secondary | ICD-10-CM | POA: Diagnosis not present

## 2023-08-11 DIAGNOSIS — Z131 Encounter for screening for diabetes mellitus: Secondary | ICD-10-CM | POA: Diagnosis not present

## 2023-08-11 DIAGNOSIS — I10 Essential (primary) hypertension: Secondary | ICD-10-CM

## 2023-08-11 LAB — URINALYSIS, ROUTINE W REFLEX MICROSCOPIC
Bilirubin Urine: NEGATIVE
Ketones, ur: NEGATIVE
Leukocytes,Ua: NEGATIVE
Nitrite: NEGATIVE
Specific Gravity, Urine: 1.015 (ref 1.000–1.030)
Total Protein, Urine: NEGATIVE
Urine Glucose: NEGATIVE
Urobilinogen, UA: 1 (ref 0.0–1.0)
WBC, UA: NONE SEEN (ref 0–?)
pH: 6.5 (ref 5.0–8.0)

## 2023-08-11 LAB — COMPREHENSIVE METABOLIC PANEL WITH GFR
ALT: 16 U/L (ref 0–35)
AST: 19 U/L (ref 0–37)
Albumin: 4.2 g/dL (ref 3.5–5.2)
Alkaline Phosphatase: 76 U/L (ref 39–117)
BUN: 12 mg/dL (ref 6–23)
CO2: 30 meq/L (ref 19–32)
Calcium: 9.3 mg/dL (ref 8.4–10.5)
Chloride: 103 meq/L (ref 96–112)
Creatinine, Ser: 0.77 mg/dL (ref 0.40–1.20)
GFR: 80.81 mL/min (ref >60.00)
Glucose, Bld: 94 mg/dL (ref 70–99)
Potassium: 4.6 meq/L (ref 3.5–5.1)
Sodium: 141 meq/L (ref 135–145)
Total Bilirubin: 0.7 mg/dL (ref 0.2–1.2)
Total Protein: 7.2 g/dL (ref 6.0–8.3)

## 2023-08-11 LAB — CBC
HCT: 39.3 % (ref 36.0–46.0)
Hemoglobin: 13.4 g/dL (ref 12.0–15.0)
MCHC: 34.2 g/dL (ref 30.0–36.0)
MCV: 113.9 fl — ABNORMAL HIGH (ref 78.0–100.0)
Platelets: 233 10*3/uL (ref 150.0–400.0)
RBC: 3.45 Mil/uL — ABNORMAL LOW (ref 3.87–5.11)
RDW: 17.4 % — ABNORMAL HIGH (ref 11.5–15.5)
WBC: 3.7 10*3/uL — ABNORMAL LOW (ref 4.0–10.5)

## 2023-08-11 LAB — HEMOGLOBIN A1C: Hgb A1c MFr Bld: 5.3 % (ref 4.6–6.5)

## 2023-08-11 LAB — LIPID PANEL
Cholesterol: 234 mg/dL — ABNORMAL HIGH (ref 0–200)
HDL: 84.8 mg/dL (ref >39.00)
LDL Cholesterol: 128 mg/dL — ABNORMAL HIGH (ref 0–99)
NonHDL: 149.67
Total CHOL/HDL Ratio: 3
Triglycerides: 108 mg/dL (ref 0.0–149.0)
VLDL: 21.6 mg/dL (ref 0.0–40.0)

## 2023-08-11 MED ORDER — CHLORTHALIDONE 25 MG PO TABS: 25.0000 mg | ORAL_TABLET | Freq: Every day | ORAL | 1 refills | Status: DC

## 2023-08-11 MED ORDER — CARVEDILOL 12.5 MG PO TABS: 12.5000 mg | ORAL_TABLET | Freq: Two times a day (BID) | ORAL | 0 refills | Status: DC

## 2023-08-11 MED ORDER — POTASSIUM CHLORIDE CRYS ER 20 MEQ PO TBCR: 20.0000 meq | EXTENDED_RELEASE_TABLET | Freq: Every day | ORAL | 0 refills | Status: DC

## 2023-08-11 NOTE — Progress Notes (Signed)
 Established Patient Office Visit   Subjective:  Patient ID: Tammy Booker, female    DOB: 20-Apr-1955  Age: 68 y.o. MRN: [id number]  No chief complaint on file.   HPI Encounter Diagnoses  Name Primary?   Screening for diabetes mellitus Yes   Essential hypertension    Screening for cholesterol level    Here for follow-up of hypertension and screening for elevated cholesterol and diabetes.  She is having no problems with the carvedilol  and chlorthalidone .  Continues potassium.  She did not take her medications this morning because she is fasting.  Blood pressures at home normally in the 130s over 80s when she is taking her medications.   Review of Systems  Constitutional: Negative.   HENT: Negative.    Eyes:  Negative for blurred vision, discharge and redness.  Respiratory: Negative.    Cardiovascular: Negative.   Gastrointestinal:  Negative for abdominal pain.  Genitourinary: Negative.   Musculoskeletal: Negative.  Negative for myalgias.  Skin:  Negative for rash.  Neurological:  Negative for tingling, loss of consciousness and weakness.  Endo/Heme/Allergies:  Negative for polydipsia.     Current Outpatient Medications:    Blood Pressure Monitoring (COMFORT TOUCH BP CUFF/MEDIUM) MISC, 1 Units by Does not apply route as directed., Disp: 1 each, Rfl: CUFF   clobetasol  ointment (TEMOVATE ) 0.05 %, MAY APPLY TOPICALLY TO THE AFFECTED AREA(S) TWO TIMES PER DAY *NOT TO THE FACE OR PRIVATE AREA(S)*, Disp: 30 g, Rfl: 0   fluticasone  (FLONASE ) 50 MCG/ACT nasal spray, Place 2 sprays into both nostrils daily., Disp: 16 g, Rfl: 6   carvedilol  (COREG ) 12.5 MG tablet, Take 1 tablet (12.5 mg total) by mouth 2 (two) times daily with a meal., Disp: 180 tablet, Rfl: 0   chlorthalidone  (HYGROTON ) 25 MG tablet, Take 1 tablet (25 mg total) by mouth daily., Disp: 30 tablet, Rfl: 1   potassium chloride  SA (KLOR-CON  M) 20 MEQ tablet, Take 1 tablet (20 mEq total) by mouth daily., Disp: 90 tablet, Rfl:  0   Objective:     BP (!) 152/86 (Cuff Size: Normal)   Pulse 67   Temp 97.9 F (36.6 C) (Temporal)   Ht 5\' 11"  (1.803 m)   Wt 231 lb (104.8 kg)   SpO2 99%   BMI 32.22 kg/m  BP Readings from Last 3 Encounters:  08/11/23 (!) 152/86  06/16/23 (!) 162/82  06/03/23 110/76   Wt Readings from Last 3 Encounters:  08/11/23 231 lb (104.8 kg)  06/16/23 228 lb 6.4 oz (103.6 kg)  06/03/23 224 lb 9.6 oz (101.9 kg)      Physical Exam Constitutional:      General: She is not in acute distress.    Appearance: Normal appearance. She is not ill-appearing, toxic-appearing or diaphoretic.  HENT:     Head: Normocephalic and atraumatic.     Right Ear: External ear normal.     Left Ear: External ear normal.  Eyes:     General: No scleral icterus.       Right eye: No discharge.        Left eye: No discharge.     Extraocular Movements: Extraocular movements intact.     Conjunctiva/sclera: Conjunctivae normal.  Pulmonary:     Effort: Pulmonary effort is normal. No respiratory distress.  Skin:    General: Skin is warm and dry.  Neurological:     Mental Status: She is alert and oriented to person, place, and time.  Psychiatric:  Mood and Affect: Mood normal.        Behavior: Behavior normal.      No results found for any visits on 08/11/23.    The ASCVD Risk score (Arnett DK, et al., 2019) failed to calculate for the following reasons:   Cannot find a previous HDL lab   Cannot find a previous total cholesterol lab    Assessment & Plan:   Screening for diabetes mellitus -     CBC -     Comprehensive metabolic panel with GFR -     Hemoglobin A1c  Essential hypertension -     Carvedilol ; Take 1 tablet (12.5 mg total) by mouth 2 (two) times daily with a meal.  Dispense: 180 tablet; Refill: 0 -     Chlorthalidone ; Take 1 tablet (25 mg total) by mouth daily.  Dispense: 30 tablet; Refill: 1 -     Potassium Chloride  Crys ER; Take 1 tablet (20 mEq total) by mouth daily.   Dispense: 90 tablet; Refill: 0 -     CBC -     Comprehensive metabolic panel with GFR -     Urinalysis, Routine w reflex microscopic  Screening for cholesterol level -     Comprehensive metabolic panel with GFR -     Lipid panel    Return in about 8 weeks (around 10/06/2023).  Continue current medications.  Continue checking blood pressures.  Further recommendations made pending results of labs.  Follow-up in 8 weeks.  Tonna Frederic, MD

## 2023-09-17 ENCOUNTER — Other Ambulatory Visit: Payer: Self-pay | Admitting: Family Medicine

## 2023-09-17 DIAGNOSIS — L280 Lichen simplex chronicus: Secondary | ICD-10-CM

## 2023-11-25 ENCOUNTER — Other Ambulatory Visit: Payer: Self-pay | Admitting: Family Medicine

## 2023-11-25 DIAGNOSIS — I10 Essential (primary) hypertension: Secondary | ICD-10-CM

## 2023-12-30 ENCOUNTER — Other Ambulatory Visit: Payer: Self-pay | Admitting: Family Medicine

## 2023-12-30 DIAGNOSIS — I10 Essential (primary) hypertension: Secondary | ICD-10-CM

## 2024-01-04 ENCOUNTER — Other Ambulatory Visit: Payer: Self-pay | Admitting: Family Medicine

## 2024-01-04 DIAGNOSIS — L280 Lichen simplex chronicus: Secondary | ICD-10-CM

## 2024-01-17 ENCOUNTER — Other Ambulatory Visit: Payer: Self-pay | Admitting: Family Medicine

## 2024-01-18 NOTE — Telephone Encounter (Signed)
 Requesting: triamcinolone  ointment (KENALOG ) 0.5 % Last Visit: 08/11/2023 Next Visit: Visit date not found Last Refill: 09/19/2023  Please Advise

## 2024-02-20 ENCOUNTER — Ambulatory Visit (INDEPENDENT_AMBULATORY_CARE_PROVIDER_SITE_OTHER)

## 2024-02-20 ENCOUNTER — Telehealth: Payer: Self-pay

## 2024-02-20 VITALS — BP 130/70 | HR 70 | Temp 98.4°F | Ht 71.0 in | Wt 219.8 lb

## 2024-02-20 DIAGNOSIS — Z0000 Encounter for general adult medical examination without abnormal findings: Secondary | ICD-10-CM | POA: Diagnosis not present

## 2024-02-20 NOTE — Progress Notes (Signed)
 Chief Complaint  Patient presents with   Medicare Wellness     Subjective:   Tammy Booker is a 68 y.o. female who presents for a Medicare Annual Wellness Visit.  Allergies (verified) Patient has no known allergies.   History: History reviewed. No pertinent past medical history. History reviewed. No pertinent surgical history. History reviewed. No pertinent family history. Social History   Occupational History   Not on file  Tobacco Use   Smoking status: Never   Smokeless tobacco: Never  Vaping Use   Vaping status: Never Used  Substance and Sexual Activity   Alcohol use: Yes    Comment: 2 glasses of wine per day   Drug use: Not Currently   Sexual activity: Not on file   Tobacco Counseling Counseling given: Not Answered  SDOH Screenings   Food Insecurity: No Food Insecurity (02/20/2024)  Housing: Unknown (02/20/2024)  Transportation Needs: No Transportation Needs (02/20/2024)  Utilities: Not At Risk (02/20/2024)  Alcohol Screen: Low Risk  (02/20/2024)  Depression (PHQ2-9): Low Risk  (02/20/2024)  Financial Resource Strain: Low Risk  (02/20/2024)  Physical Activity: Inactive (02/20/2024)  Social Connections: Socially Isolated (02/20/2024)  Stress: No Stress Concern Present (02/20/2024)  Tobacco Use: Low Risk  (02/20/2024)  Health Literacy: Adequate Health Literacy (02/20/2024)   See flowsheets for full screening details  Depression Screen PHQ 2 & 9 Depression Scale- Over the past 2 weeks, how often have you been bothered by any of the following problems? Little interest or pleasure in doing things: 0 Feeling down, depressed, or hopeless (PHQ Adolescent also includes...irritable): 0 PHQ-2 Total Score: 0 Trouble falling or staying asleep, or sleeping too much: 0 Feeling tired or having little energy: 1 Poor appetite or overeating (PHQ Adolescent also includes...weight loss): 0 Feeling bad about yourself - or that you are a failure or have let yourself or your  family down: 0 Trouble concentrating on things, such as reading the newspaper or watching television (PHQ Adolescent also includes...like school work): 0 Moving or speaking so slowly that other people could have noticed. Or the opposite - being so fidgety or restless that you have been moving around a lot more than usual: 0 Thoughts that you would be better off dead, or of hurting yourself in some way: 0 PHQ-9 Total Score: 1 If you checked off any problems, how difficult have these problems made it for you to do your work, take care of things at home, or get along with other people?: Not difficult at all  Depression Treatment Depression Interventions/Treatment : EYV7-0 Score <4 Follow-up Not Indicated     Goals Addressed             This Visit's Progress    Patient Stated       02/20/2024, wants to be more active       Visit info / Clinical Intake: Medicare Wellness Visit Type:: Initial Annual Wellness Visit Persons participating in visit:: patient Medicare Wellness Visit Mode:: In-person (required for WTM) Information given by:: patient Interpreter Needed?: No Pre-visit prep was completed: yes AWV questionnaire completed by patient prior to visit?: no Living arrangements:: with family/others Patient's Overall Health Status Rating: very good Typical amount of pain: none Does pain affect daily life?: no Are you currently prescribed opioids?: no  Dietary Habits and Nutritional Risks How many meals a day?: 2 Eats fruit and vegetables daily?: yes Most meals are obtained by: preparing own meals In the last 2 weeks, have you had any of the following?: none Diabetic::  no  Functional Status Activities of Daily Living (to include ambulation/medication): Independent Ambulation: Independent Medication Administration: Independent Home Management: Independent Manage your own finances?: yes Primary transportation is: driving Concerns about vision?: no *vision screening is  required for WTM* Concerns about hearing?: no  Fall Screening Falls in the past year?: 0 Number of falls in past year: 0 Was there an injury with Fall?: 0 Fall Risk Category Calculator: 0 Patient Fall Risk Level: Low Fall Risk  Fall Risk Patient at Risk for Falls Due to: Medication side effect Fall risk Follow up: Falls prevention discussed; Falls evaluation completed  Home and Transportation Safety: All rugs have non-skid backing?: N/A, no rugs All stairs or steps have railings?: yes Grab bars in the bathtub or shower?: (!) no Have non-skid surface in bathtub or shower?: yes Good home lighting?: yes Regular seat belt use?: yes Hospital stays in the last year:: no  Cognitive Assessment Difficulty concentrating, remembering, or making decisions? : no Will 6CIT or Mini Cog be Completed: yes What year is it?: 0 points What month is it?: 0 points Give patient an address phrase to remember (5 components): [address] About what time is it?: 0 points Count backwards from 20 to 1: 0 points Say the months of the year in reverse: 0 points Repeat the address phrase from earlier: 2 points 6 CIT Score: 2 points  Advance Directives (For Healthcare) Does Patient Have a Medical Advance Directive?: No Would patient like information on creating a medical advance directive?: No - Patient declined  Reviewed/Updated  Reviewed/Updated: Reviewed All (Medical, Surgical, Family, Medications, Allergies, Care Teams, Patient Goals)        Objective:    Today's Vitals   02/20/24 0926  BP: 130/70  Pulse: 70  Temp: 98.4 F (36.9 C)  TempSrc: Oral  SpO2: 93%  Weight: 219 lb 12.8 oz (99.7 kg)  Height: 5' 11 (1.803 m)   Body mass index is 30.66 kg/m.  Current Medications (verified) Outpatient Encounter Medications as of 02/20/2024  Medication Sig   Blood Pressure Monitoring (COMFORT TOUCH BP CUFF/MEDIUM) MISC 1 Units by Does not apply route as directed.   carvedilol  (COREG )  12.5 MG tablet TAKE 1 TABLET BY MOUTH TWICE DAILY WITH A MEAL   chlorthalidone  (HYGROTON ) 25 MG tablet Take 1 tablet by mouth once daily   fluticasone  (FLONASE ) 50 MCG/ACT nasal spray Place 2 sprays into both nostrils daily.   potassium chloride  SA (KLOR-CON  M) 20 MEQ tablet Take 1 tablet (20 mEq total) by mouth daily.   triamcinolone  ointment (KENALOG ) 0.5 % Apply topically 2 (two) times daily.   No facility-administered encounter medications on file as of 02/20/2024.   Hearing/Vision screen Hearing Screening - Comments:: Denies hearing issues Vision Screening - Comments:: regular eye exams, [facility] Immunizations and Health Maintenance Health Maintenance  Topic Date Due   Pneumococcal Vaccine: 50+ Years (1 of 1 - PCV) Never done   Bone Density Scan  Never done   Mammogram  08/05/2023   Medicare Annual Wellness (AWV)  02/19/2025   DTaP/Tdap/Td (2 - Td or Tdap) 07/27/2033   Influenza Vaccine  Completed   Hepatitis C Screening  Completed   Meningococcal B Vaccine  Aged Out   Colonoscopy  Discontinued   COVID-19 Vaccine  Discontinued   Zoster Vaccines- Shingrix  Discontinued        Assessment/Plan:  This is a routine wellness examination for [place].  Patient Care Team: Berneta Elsie Sayre, MD as PCP - General (  Family Medicine) [place], [facility]  I have personally reviewed and noted the following in the patient's chart:   Medical and social history Use of alcohol, tobacco or illicit drugs  Current medications and supplements including opioid prescriptions. Functional ability and status Nutritional status Physical activity Advanced directives List of other physicians Hospitalizations, surgeries, and ER visits in previous 12 months Vitals Screenings to include cognitive, depression, and falls Referrals and appointments  No orders of the defined types were placed in this encounter.  In addition, I have reviewed and discussed with patient  certain preventive protocols, quality metrics, and best practice recommendations. A written personalized care plan for preventive services as well as general preventive health recommendations were provided to patient.   Ardella FORBES Dawn, LPN   [id number]/75/7974   Return in 1 year (on 02/19/2025).  After Visit Summary: (In Person-Printed) AVS printed and given to the patient  Nurse Notes: Declines pneumonia vaccine, mammogram and dexa at this time. Requested refills of potassium and triamcinolone  cream.

## 2024-02-20 NOTE — Patient Instructions (Addendum)
 Tammy Booker,  Thank you for taking the time for your Medicare Wellness Visit. I appreciate your continued commitment to your health goals. Please review the care plan we discussed, and feel free to reach out if I can assist you further.  Please note that Annual Wellness Visits do not include a physical exam. Some assessments may be limited, especially if the visit was conducted virtually. If needed, we may recommend an in-person follow-up with your provider.  Ongoing Care Seeing your primary care provider every 3 to 6 months helps us  monitor your health and provide consistent, personalized care.   Referrals If a referral was made during today's visit and you haven't received any updates within two weeks, please contact the referred provider directly to check on the status.  Recommended Screenings:  Health Maintenance  Topic Date Due   Pneumococcal Vaccine for age over 70 (1 of 1 - PCV) Never done   Osteoporosis screening with Bone Density Scan  Never done   Breast Cancer Screening  08/05/2023   Medicare Annual Wellness Visit  02/19/2025   DTaP/Tdap/Td vaccine (2 - Td or Tdap) 07/27/2033   Flu Shot  Completed   Hepatitis C Screening  Completed   Meningitis B Vaccine  Aged Out   Colon Cancer Screening  Discontinued   COVID-19 Vaccine  Discontinued   Zoster (Shingles) Vaccine  Discontinued       02/20/2024    9:35 AM  Advanced Directives  Does Patient Have a Medical Advance Directive? No  Would patient like information on creating a medical advance directive? No - Patient declined    Vision: Annual vision screenings are recommended for early detection of glaucoma, cataracts, and diabetic retinopathy. These exams can also reveal signs of chronic conditions such as diabetes and high blood pressure.  Dental: Annual dental screenings help detect early signs of oral cancer, gum disease, and other conditions linked to overall health, including heart disease and diabetes.  Please see the  attached documents for additional preventive care recommendations.

## 2024-02-20 NOTE — Telephone Encounter (Signed)
 Patient needs a refill of potassium and triamcinolone  cream.

## 2024-02-28 ENCOUNTER — Other Ambulatory Visit: Payer: Self-pay | Admitting: Family Medicine

## 2024-02-28 DIAGNOSIS — I10 Essential (primary) hypertension: Secondary | ICD-10-CM

## 2024-02-28 NOTE — Telephone Encounter (Unsigned)
 Copied from CRM #[id number]. Topic: Clinical - Medication Refill >> Feb 28, 2024 10:42 AM Carlyon D wrote: Medication:  triamcinolone  ointment (KENALOG ) 0.5 %   potassium chloride  SA (KLOR-CON  M) 20 MEQ tablet  chlorthalidone  (HYGROTON ) 25 MG tablet     Has the patient contacted their pharmacy? Yes (Agent: If no, request that the patient contact the pharmacy for the refill. If patient does not wish to contact the pharmacy document the reason why and proceed with request.) (Agent: If yes, when and what did the pharmacy advise?)  This is the patient's preferred pharmacy:  [id number] - [place], [place] - [address] [place] [address] Phone: [phone] Fax: [phone]   Is this the correct pharmacy for this prescription? Yes If no, delete pharmacy and type the correct one.   Has the prescription been filled recently? No  Is the patient out of the medication? Yes out of the ointment   Has the patient been seen for an appointment in the last year OR does the patient have an upcoming appointment? Yes  Can we respond through MyChart? Yes but prefers phone call   Agent: Please be advised that Rx refills may take up to 3 business days. We ask that you follow-up with your pharmacy.

## 2024-03-01 MED ORDER — POTASSIUM CHLORIDE CRYS ER 20 MEQ PO TBCR: 20.0000 meq | EXTENDED_RELEASE_TABLET | Freq: Every day | ORAL | 0 refills | Status: AC

## 2024-03-01 MED ORDER — TRIAMCINOLONE ACETONIDE 0.5 % EX OINT: TOPICAL_OINTMENT | Freq: Two times a day (BID) | CUTANEOUS | 0 refills | Status: AC

## 2024-03-01 MED ORDER — CHLORTHALIDONE 25 MG PO TABS: 25.0000 mg | ORAL_TABLET | Freq: Every day | ORAL | 0 refills | Status: DC

## 2024-03-30 ENCOUNTER — Other Ambulatory Visit: Payer: Self-pay | Admitting: Family Medicine

## 2024-03-30 DIAGNOSIS — I10 Essential (primary) hypertension: Secondary | ICD-10-CM

## 2024-04-02 ENCOUNTER — Ambulatory Visit: Admitting: Family Medicine

## 2024-04-02 ENCOUNTER — Encounter: Payer: Self-pay | Admitting: Family Medicine

## 2024-04-02 VITALS — BP 106/72 | HR 75 | Temp 97.6°F | Ht 71.0 in | Wt 221.0 lb

## 2024-04-02 DIAGNOSIS — Z1231 Encounter for screening mammogram for malignant neoplasm of breast: Secondary | ICD-10-CM

## 2024-04-02 DIAGNOSIS — I10 Essential (primary) hypertension: Secondary | ICD-10-CM

## 2024-04-02 DIAGNOSIS — Z780 Asymptomatic menopausal state: Secondary | ICD-10-CM

## 2024-04-02 LAB — BASIC METABOLIC PANEL WITH GFR
BUN: 12 mg/dL (ref 6–23)
CO2: 31 meq/L (ref 19–32)
Calcium: 9.5 mg/dL (ref 8.4–10.5)
Chloride: 100 meq/L (ref 96–112)
Creatinine, Ser: 0.74 mg/dL (ref 0.40–1.20)
GFR: 84.38 mL/min
Glucose, Bld: 95 mg/dL (ref 70–99)
Potassium: 3.6 meq/L (ref 3.5–5.1)
Sodium: 137 meq/L (ref 135–145)

## 2024-04-02 MED ORDER — CHLORTHALIDONE 25 MG PO TABS: 25.0000 mg | ORAL_TABLET | Freq: Every day | ORAL | 2 refills | Status: AC

## 2024-04-02 MED ORDER — CARVEDILOL 12.5 MG PO TABS: 12.5000 mg | ORAL_TABLET | Freq: Two times a day (BID) | ORAL | 0 refills | Status: DC

## 2024-04-02 MED ORDER — CARVEDILOL 12.5 MG PO TABS: 12.5000 mg | ORAL_TABLET | Freq: Two times a day (BID) | ORAL | 2 refills | Status: AC

## 2024-04-02 MED ORDER — CHLORTHALIDONE 25 MG PO TABS: 25.0000 mg | ORAL_TABLET | Freq: Every day | ORAL | 0 refills | Status: DC

## 2024-04-02 NOTE — Progress Notes (Signed)
 "  Established Patient Office Visit   Subjective:  Patient ID: Tammy Booker, female    DOB: 1955/07/17  Age: 69 y.o. MRN: [id number]  Chief Complaint  Patient presents with   Medication Refill    Patient states noting to discuss.     Medication Refill Pertinent negatives include no abdominal pain, myalgias, rash or weakness.   Encounter Diagnoses  Name Primary?   Essential hypertension Yes   Postmenopausal estrogen deficiency    Encounter for screening mammogram for malignant neoplasm of breast    For follow-up of hypertension.  Seen back in May and asked to return in 8 weeks.  Continues with carvedilol  12.5 twice daily, chlorthalidone  25 mg and potassium.  She has been exercising has been able to lose some weight.  She is exercising by walking.  Drinks 1 glass of wine on occasion.  She lives at home with her brother who is a double lower extremity amputee.   Review of Systems  Constitutional: Negative.   HENT: Negative.    Eyes:  Negative for blurred vision, discharge and redness.  Respiratory: Negative.    Cardiovascular: Negative.   Gastrointestinal:  Negative for abdominal pain.  Genitourinary: Negative.   Musculoskeletal: Negative.  Negative for myalgias.  Skin:  Negative for rash.  Neurological:  Negative for tingling, loss of consciousness and weakness.  Endo/Heme/Allergies:  Negative for polydipsia.      02/20/2024    9:31 AM 09/08/2022   11:11 AM 02/16/2022    9:30 AM  Depression screen PHQ 2/9  Decreased Interest 0 0 0  Down, Depressed, Hopeless 0 0 0  PHQ - 2 Score 0 0 0  Altered sleeping 0    Tired, decreased energy 1    Change in appetite 0    Feeling bad or failure about yourself  0    Trouble concentrating 0    Moving slowly or fidgety/restless 0    Suicidal thoughts 0    PHQ-9 Score 1    Difficult doing work/chores Not difficult at all        Current Medications[1]   Objective:     BP 106/72   Pulse 75   Temp 97.6 F (36.4 C) (Oral)    Ht 5' 11 (1.803 m)   Wt 221 lb (100.2 kg)   SpO2 95%   BMI 30.82 kg/m  BP Readings from Last 3 Encounters:  04/02/24 106/72  02/20/24 130/70  08/11/23 (!) 152/86   Wt Readings from Last 3 Encounters:  04/02/24 221 lb (100.2 kg)  02/20/24 219 lb 12.8 oz (99.7 kg)  08/11/23 231 lb (104.8 kg)      Physical Exam Constitutional:      General: She is not in acute distress.    Appearance: Normal appearance. She is not ill-appearing, toxic-appearing or diaphoretic.  HENT:     Head: Normocephalic and atraumatic.     Right Ear: External ear normal.     Left Ear: External ear normal.     Mouth/Throat:     Mouth: Mucous membranes are moist.     Pharynx: Oropharynx is clear. No oropharyngeal exudate or posterior oropharyngeal erythema.  Eyes:     General: No scleral icterus.       Right eye: No discharge.        Left eye: No discharge.     Extraocular Movements: Extraocular movements intact.     Conjunctiva/sclera: Conjunctivae normal.     Pupils: Pupils are equal, round, and reactive to light.  Cardiovascular:  Rate and Rhythm: Normal rate and regular rhythm.  Pulmonary:     Effort: Pulmonary effort is normal. No respiratory distress.     Breath sounds: Normal breath sounds. No wheezing or rales.  Abdominal:     General: Bowel sounds are normal.  Musculoskeletal:     Cervical back: No rigidity or tenderness.  Skin:    General: Skin is warm and dry.  Neurological:     Mental Status: She is alert and oriented to person, place, and time.  Psychiatric:        Mood and Affect: Mood normal.        Behavior: Behavior normal.      No results found for any visits on 04/02/24.    The 10-year ASCVD risk score (Arnett DK, et al., 2019) is: 6.8%    Assessment & Plan:   Essential hypertension -     Basic metabolic panel with GFR -     Carvedilol ; Take 1 tablet (12.5 mg total) by mouth 2 (two) times daily with a meal.  Dispense: 180 tablet; Refill: 2 -     Chlorthalidone ;  Take 1 tablet (25 mg total) by mouth daily.  Dispense: 90 tablet; Refill: 2  Postmenopausal estrogen deficiency -     DG Bone Density; Future  Encounter for screening mammogram for malignant neoplasm of breast -     Digital Screening Mammogram, Left and Right; Future    Return in about 6 months (around 09/30/2024), or Please call your GYN provider to set up appointment for Pap smear.Tammy Elsie Sim Berneta, MD    [1]  Current Outpatient Medications:    Blood Pressure Monitoring (COMFORT TOUCH BP CUFF/MEDIUM) MISC, 1 Units by Does not apply route as directed., Disp: 1 each, Rfl: CUFF   fluticasone  (FLONASE ) 50 MCG/ACT nasal spray, Place 2 sprays into both nostrils daily., Disp: 16 g, Rfl: 6   potassium chloride  SA (KLOR-CON  M) 20 MEQ tablet, Take 1 tablet (20 mEq total) by mouth daily., Disp: 90 tablet, Rfl: 0   triamcinolone  ointment (KENALOG ) 0.5 %, Apply topically 2 (two) times daily., Disp: 30 g, Rfl: 0   carvedilol  (COREG ) 12.5 MG tablet, Take 1 tablet (12.5 mg total) by mouth 2 (two) times daily with a meal., Disp: 180 tablet, Rfl: 2   chlorthalidone  (HYGROTON ) 25 MG tablet, Take 1 tablet (25 mg total) by mouth daily., Disp: 90 tablet, Rfl: 2  "

## 2024-04-26 ENCOUNTER — Other Ambulatory Visit (HOSPITAL_BASED_OUTPATIENT_CLINIC_OR_DEPARTMENT_OTHER)

## 2024-05-03 ENCOUNTER — Inpatient Hospital Stay (HOSPITAL_BASED_OUTPATIENT_CLINIC_OR_DEPARTMENT_OTHER): Admission: RE | Admit: 2024-05-03 | Source: Ambulatory Visit

## 2024-06-04 ENCOUNTER — Ambulatory Visit: Admitting: Internal Medicine

## 2024-10-08 ENCOUNTER — Ambulatory Visit: Admitting: Family Medicine

## 2025-02-25 ENCOUNTER — Ambulatory Visit
